# Patient Record
Sex: Male | Born: 1985 | Race: White | Hispanic: No | Marital: Married | Smoking: Former smoker
Health system: Southern US, Community
[De-identification: ages and names within clinical notes are randomized; demographics above are authoritative.]

## PROBLEM LIST (undated history)

## (undated) DIAGNOSIS — K219 Gastro-esophageal reflux disease without esophagitis: Secondary | ICD-10-CM

## (undated) HISTORY — PX: TONSILLECTOMY: SUR1361

## (undated) HISTORY — DX: Gastro-esophageal reflux disease without esophagitis: K21.9

---
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
## 2007-05-21 HISTORY — PX: THYMECTOMY: SHX1063

## 2007-05-24 ENCOUNTER — Ambulatory Visit: Payer: Self-pay | Admitting: Thoracic Surgery

## 2007-06-06 ENCOUNTER — Encounter: Payer: Self-pay | Admitting: Thoracic Surgery

## 2007-06-06 ENCOUNTER — Inpatient Hospital Stay (HOSPITAL_COMMUNITY): Admission: RE | Admit: 2007-06-06 | Discharge: 2007-06-09 | Payer: Self-pay | Admitting: Thoracic Surgery

## 2007-06-08 ENCOUNTER — Ambulatory Visit: Payer: Self-pay | Admitting: Thoracic Surgery

## 2007-06-15 ENCOUNTER — Ambulatory Visit: Payer: Self-pay | Admitting: Thoracic Surgery

## 2007-06-15 ENCOUNTER — Encounter: Admission: RE | Admit: 2007-06-15 | Discharge: 2007-06-15 | Payer: Self-pay | Admitting: Thoracic Surgery

## 2007-07-06 ENCOUNTER — Ambulatory Visit: Payer: Self-pay | Admitting: Thoracic Surgery

## 2007-07-12 ENCOUNTER — Emergency Department (HOSPITAL_COMMUNITY): Admission: EM | Admit: 2007-07-12 | Discharge: 2007-07-12 | Payer: Self-pay | Admitting: Emergency Medicine

## 2007-09-07 ENCOUNTER — Ambulatory Visit: Payer: Self-pay | Admitting: Thoracic Surgery

## 2007-10-13 ENCOUNTER — Ambulatory Visit: Payer: Self-pay | Admitting: Family Medicine

## 2007-10-13 DIAGNOSIS — K219 Gastro-esophageal reflux disease without esophagitis: Secondary | ICD-10-CM | POA: Insufficient documentation

## 2007-10-13 DIAGNOSIS — B259 Cytomegaloviral disease, unspecified: Secondary | ICD-10-CM

## 2007-10-13 DIAGNOSIS — G7000 Myasthenia gravis without (acute) exacerbation: Secondary | ICD-10-CM

## 2007-10-14 ENCOUNTER — Encounter: Payer: Self-pay | Admitting: Family Medicine

## 2007-10-15 LAB — CONVERTED CEMR LAB
Albumin: 4.4 g/dL (ref 3.5–5.2)
Alkaline Phosphatase: 74 units/L (ref 39–117)
CO2: 24 meq/L (ref 19–32)
Calcium: 9.1 mg/dL (ref 8.4–10.5)
Chloride: 101 meq/L (ref 96–112)
Glucose, Bld: 78 mg/dL (ref 70–99)
Hemoglobin: 17.8 g/dL — ABNORMAL HIGH (ref 13.0–17.0)
Lymphocytes Relative: 18 % (ref 12–46)
Lymphs Abs: 2.9 10*3/uL (ref 0.7–4.0)
Monocytes Relative: 12 % (ref 3–12)
Neutro Abs: 11.2 10*3/uL — ABNORMAL HIGH (ref 1.7–7.7)
Neutrophils Relative %: 69 % (ref 43–77)
Potassium: 3.6 meq/L (ref 3.5–5.3)
RBC: 5.96 M/uL — ABNORMAL HIGH (ref 4.22–5.81)
Sodium: 139 meq/L (ref 135–145)
TSH: 1.65 microintl units/mL (ref 0.350–4.50)
Total Protein: 7.2 g/dL (ref 6.0–8.3)
WBC: 16.4 10*3/uL — ABNORMAL HIGH (ref 4.0–10.5)

## 2007-10-18 ENCOUNTER — Encounter: Payer: Self-pay | Admitting: Family Medicine

## 2007-10-18 ENCOUNTER — Telehealth (INDEPENDENT_AMBULATORY_CARE_PROVIDER_SITE_OTHER): Payer: Self-pay | Admitting: *Deleted

## 2007-10-23 ENCOUNTER — Telehealth: Payer: Self-pay | Admitting: Family Medicine

## 2007-10-30 ENCOUNTER — Telehealth: Payer: Self-pay | Admitting: Family Medicine

## 2007-10-31 ENCOUNTER — Encounter: Payer: Self-pay | Admitting: Family Medicine

## 2007-11-15 ENCOUNTER — Ambulatory Visit: Payer: Self-pay | Admitting: Family Medicine

## 2007-11-15 DIAGNOSIS — L906 Striae atrophicae: Secondary | ICD-10-CM

## 2008-01-16 ENCOUNTER — Telehealth (INDEPENDENT_AMBULATORY_CARE_PROVIDER_SITE_OTHER): Payer: Self-pay | Admitting: *Deleted

## 2008-01-17 ENCOUNTER — Ambulatory Visit: Payer: Self-pay | Admitting: Family Medicine

## 2008-02-29 ENCOUNTER — Ambulatory Visit: Payer: Self-pay | Admitting: Family Medicine

## 2008-02-29 ENCOUNTER — Encounter: Admission: RE | Admit: 2008-02-29 | Discharge: 2008-02-29 | Payer: Self-pay | Admitting: Family Medicine

## 2008-02-29 DIAGNOSIS — M545 Low back pain: Secondary | ICD-10-CM

## 2008-02-29 DIAGNOSIS — M25559 Pain in unspecified hip: Secondary | ICD-10-CM

## 2008-03-01 ENCOUNTER — Encounter: Payer: Self-pay | Admitting: Family Medicine

## 2008-03-01 ENCOUNTER — Telehealth: Payer: Self-pay | Admitting: Family Medicine

## 2008-03-02 ENCOUNTER — Telehealth: Payer: Self-pay | Admitting: Family Medicine

## 2008-03-05 ENCOUNTER — Ambulatory Visit: Payer: Self-pay | Admitting: Family Medicine

## 2008-03-05 DIAGNOSIS — B029 Zoster without complications: Secondary | ICD-10-CM | POA: Insufficient documentation

## 2008-03-06 ENCOUNTER — Encounter: Payer: Self-pay | Admitting: Family Medicine

## 2008-03-12 ENCOUNTER — Telehealth: Payer: Self-pay | Admitting: Family Medicine

## 2008-03-18 ENCOUNTER — Telehealth: Payer: Self-pay | Admitting: Family Medicine

## 2008-03-20 ENCOUNTER — Telehealth: Payer: Self-pay | Admitting: Family Medicine

## 2008-04-12 ENCOUNTER — Ambulatory Visit: Payer: Self-pay | Admitting: Family Medicine

## 2008-04-12 DIAGNOSIS — J069 Acute upper respiratory infection, unspecified: Secondary | ICD-10-CM | POA: Insufficient documentation

## 2008-05-06 ENCOUNTER — Telehealth: Payer: Self-pay | Admitting: Family Medicine

## 2008-05-06 ENCOUNTER — Ambulatory Visit: Payer: Self-pay | Admitting: Family Medicine

## 2008-05-06 DIAGNOSIS — H109 Unspecified conjunctivitis: Secondary | ICD-10-CM | POA: Insufficient documentation

## 2008-05-07 ENCOUNTER — Telehealth: Payer: Self-pay | Admitting: Family Medicine

## 2008-06-17 ENCOUNTER — Encounter: Payer: Self-pay | Admitting: Family Medicine

## 2008-08-07 ENCOUNTER — Encounter: Payer: Self-pay | Admitting: Family Medicine

## 2008-10-11 ENCOUNTER — Encounter: Payer: Self-pay | Admitting: Family Medicine

## 2009-01-02 IMAGING — CR DG CHEST 2V
2 series · 2 of 2 positions shown · non-contrast
Comparison: 06/09/2007

CLINICAL DATA: MYASTHENIA GRAVIS; POSTOP FROM THYMECTOMY

CHEST - 2 VIEW

[w chest pa]
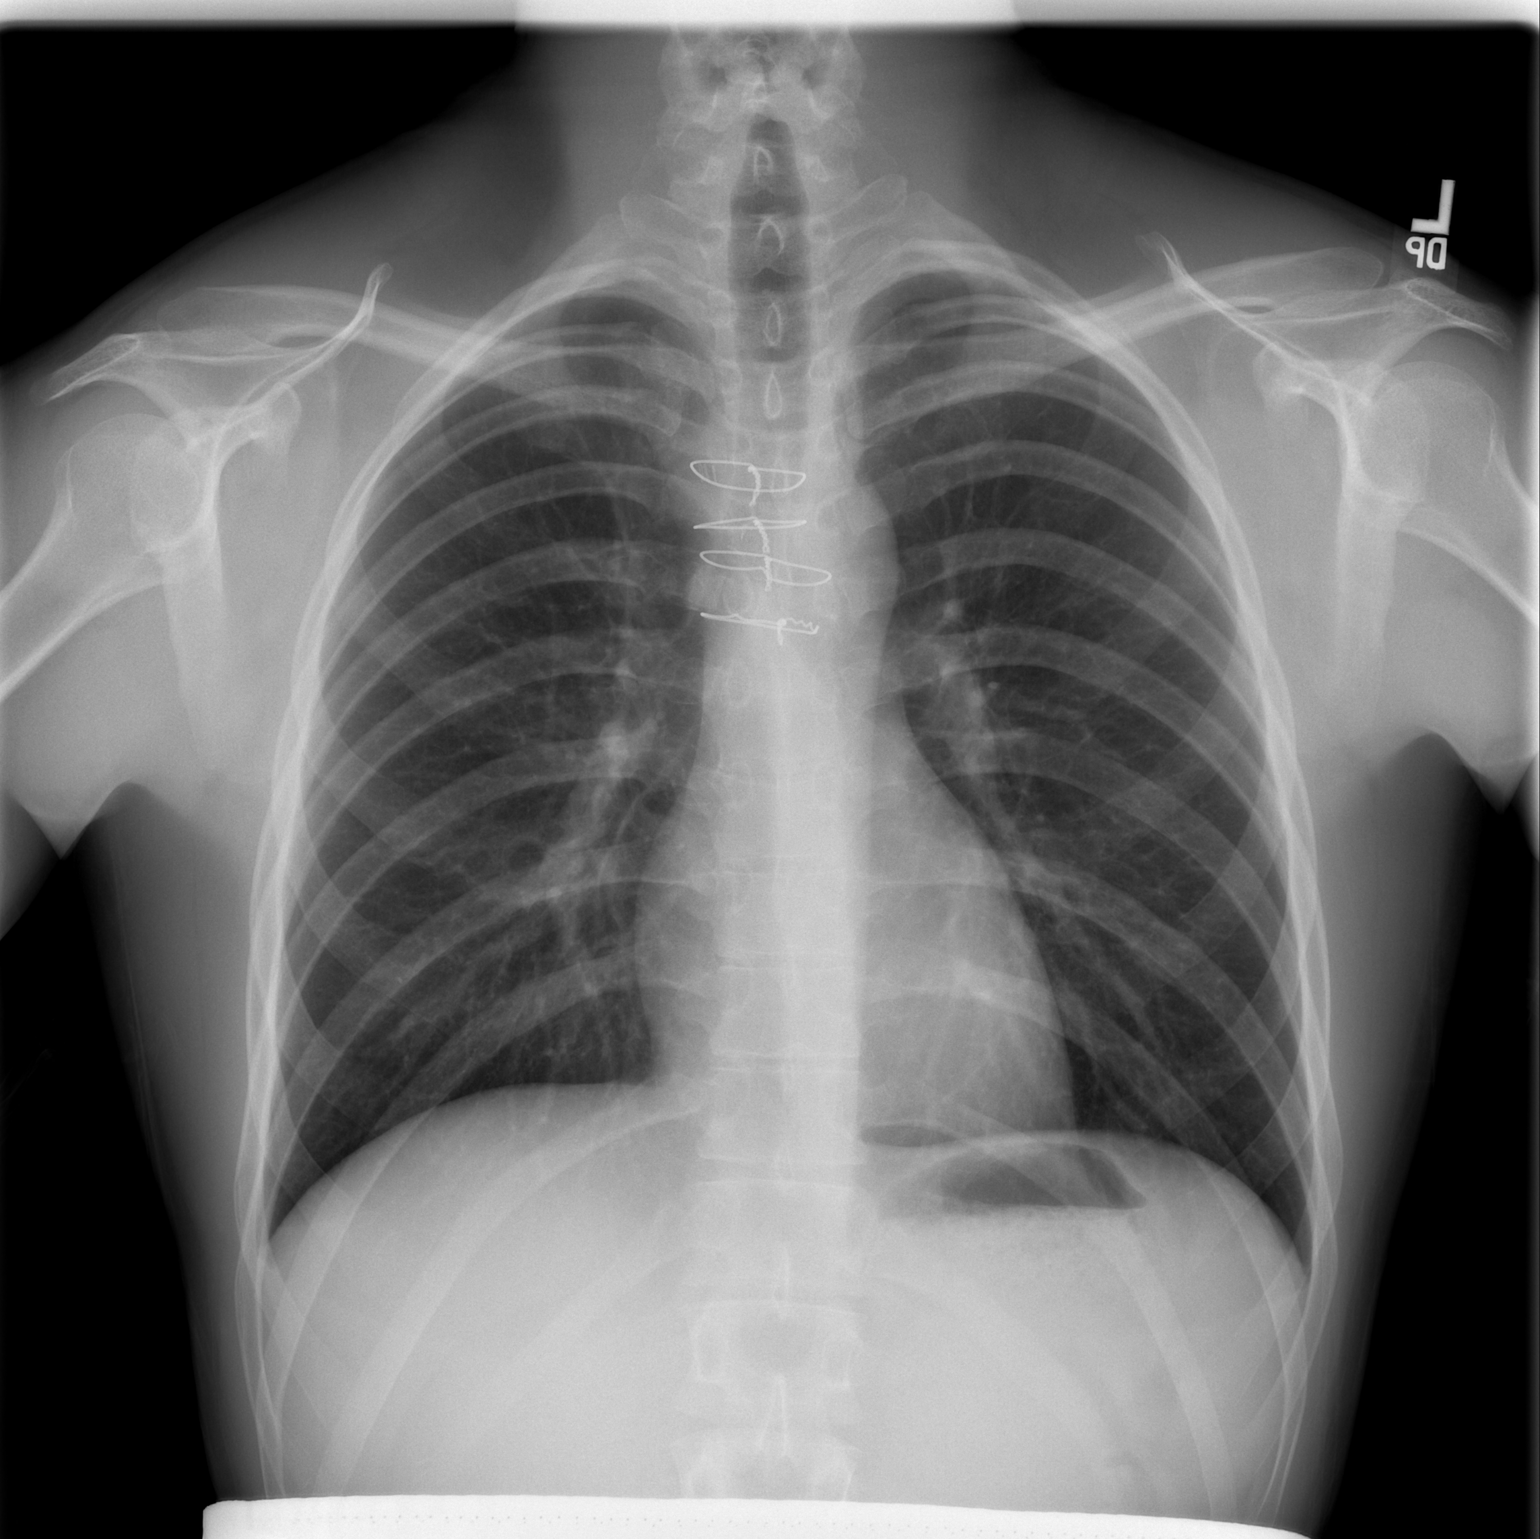

[w chest lat]
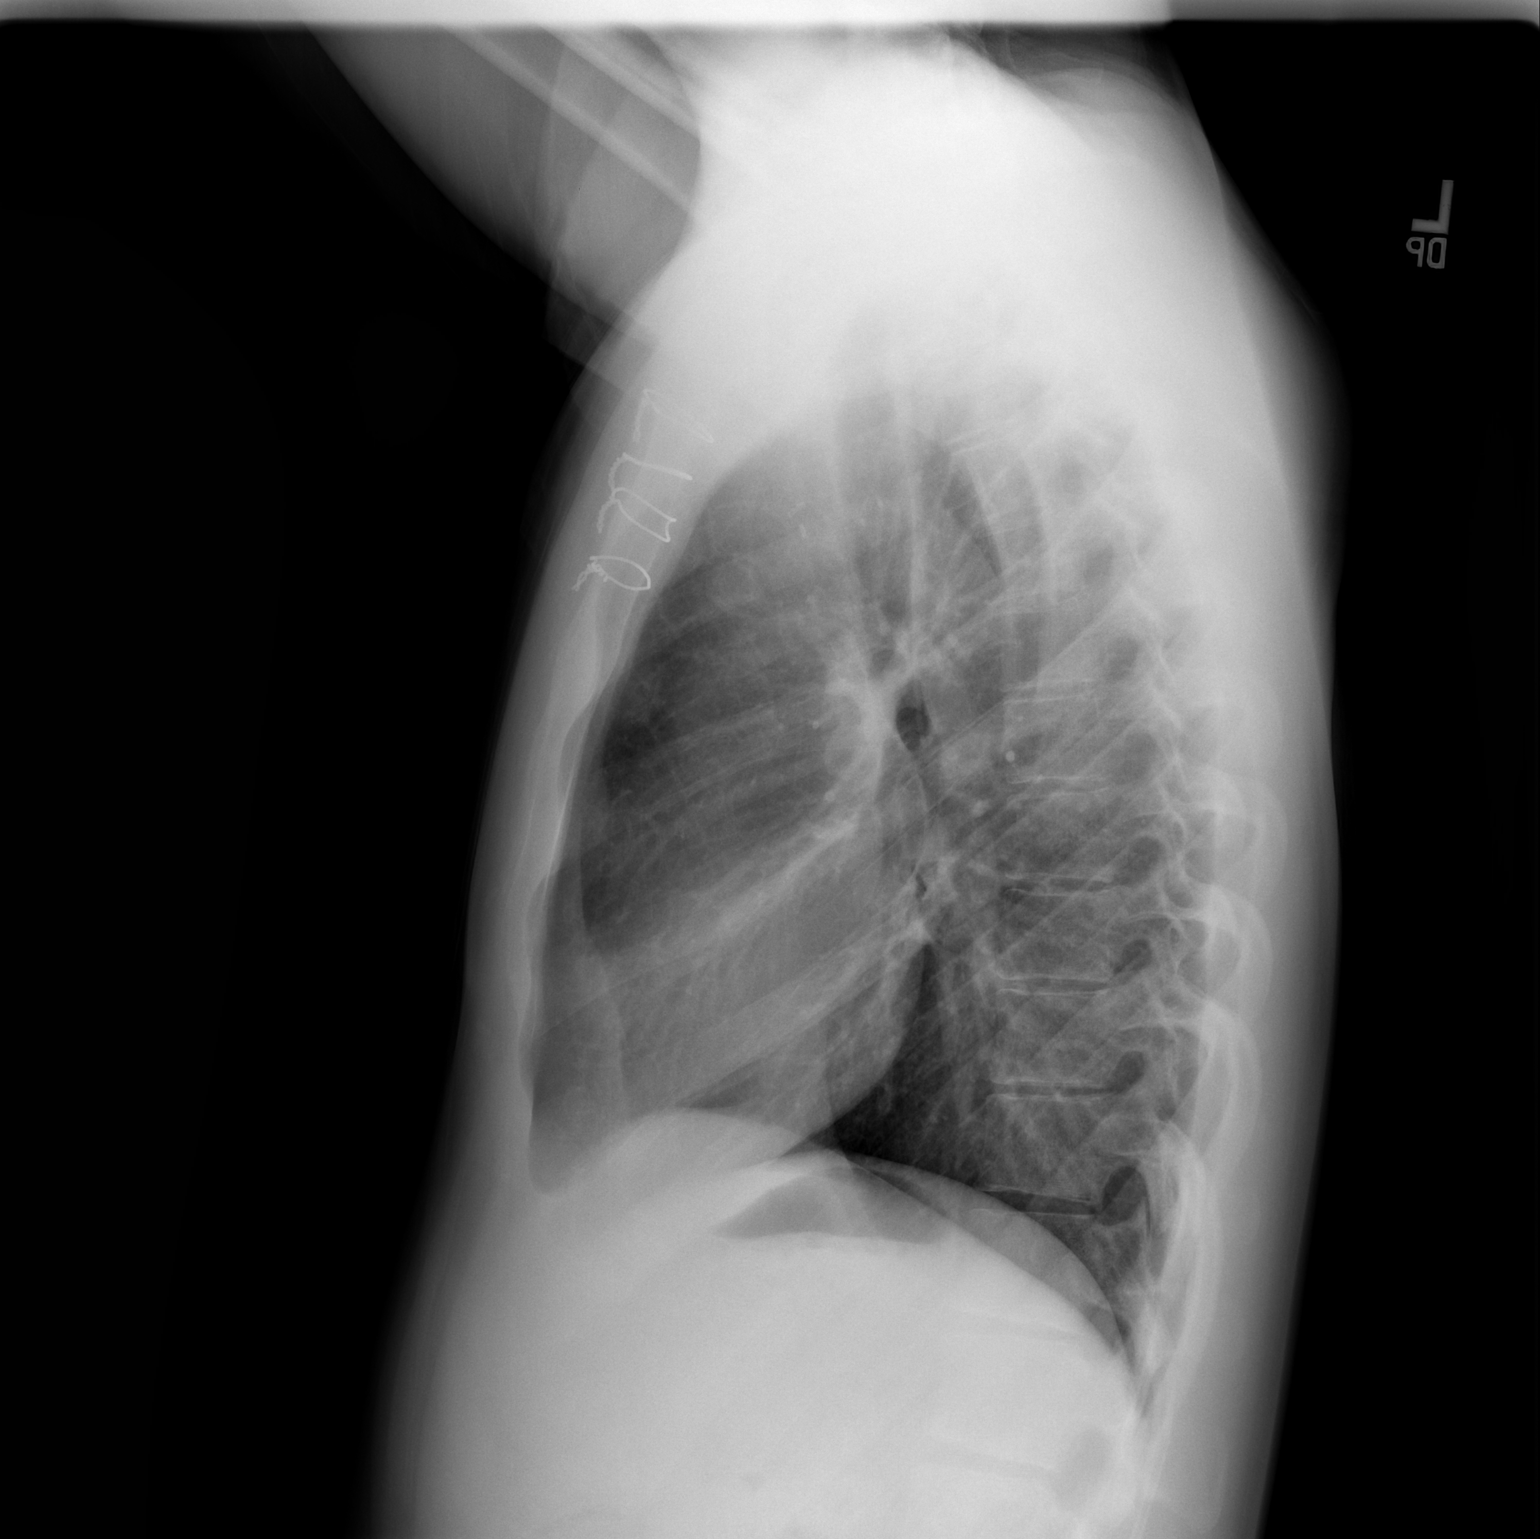

[2 of 2 positions shown; findings below may reference images not displayed]

FINDINGS: Heart size and mediastinal contours are normal.  The
patient has undergone prior sternotomy.  Both lungs are clear.
There is no evidence of pneumothorax or pleural effusion.
IMPRESSION: No active cardiopulmonary disease.

## 2009-12-24 ENCOUNTER — Ambulatory Visit: Payer: Self-pay | Admitting: Family Medicine

## 2009-12-24 DIAGNOSIS — J019 Acute sinusitis, unspecified: Secondary | ICD-10-CM | POA: Insufficient documentation

## 2010-04-21 NOTE — Assessment & Plan Note (Signed)
Summary: sinusitis   Vital Signs:  Patient profile:   25 year old male Height:      65.3 inches Weight:      144 pounds BMI:     23.83 O2 Sat:      98 % on Room air Temp:     98.8 degrees F oral Pulse rate:   72 / minute BP sitting:   123 / 85  (left arm) Cuff size:   large  Vitals Entered By: Payton Spark CMA (December 24, 2009 3:57 PM)  O2 Flow:  Room air CC: Facial pressure, congestion, cough and HA x 1 week. Mucinex not helping   Primary Care Lazara Grieser:  Nani Gasser  CC:  Facial pressure, congestion, and cough and HA x 1 week. Mucinex not helping.  History of Present Illness: 25 yo WM presents for runny nose, congestion, sneezing and HAs that started a wk ago.  He has taken Mucinex and some phenylephrine.  The meds have helped some.  He has HA pressure behind his eyes.  He feels like he is getting worse.  Denies much rhinorrhea.  Has a cough also.  Has a sore throat also.  Denies fevers or chills.  The cough is improved by taking Nyquil at night.  Denies any GI upset.  Has missed work.  Not taking anything for HA.  He is on Prednsione and Cellcept for Myasthenia Gravis.    Current Medications (verified): 1)  Prednisone 5 Mg Tabs (Prednisone) .... Take 1 1/2 Tabs By Mouth Once Daily 2)  Cellcept 250 Mg  Caps (Mycophenolate Mofetil) .... Take 2500mg  Daily  Allergies (verified): No Known Drug Allergies  Review of Systems      See HPI  Physical Exam  General:  alert, well-developed, well-nourished, and well-hydrated.   Head:  maxillary and frontal sinuses TTP Eyes:  conjunctiva clear Ears:  EACs patent; TMs translucent and gray with good cone of light and bony landmarks.  Nose:  nasal congestion present Mouth:  pharynx pink and moist.   Neck:  no masses.   Lungs:  Normal respiratory effort, chest expands symmetrically. Lungs are clear to auscultation, no crackles or wheezes. Heart:  Normal rate and regular rhythm. S1 and S2 normal without gallop, murmur,  click, rub or other extra sounds. Skin:  color normal.   Cervical Nodes:  No lymphadenopathy noted   Impression & Recommendations:  Problem # 1:  SINUSITIS, ACUTE (ICD-461.9) Will treat with 10 days of Amoxicillin along with supportive care measures.  AT risk for secondary bacterial infeciton, on immunosuppressive agents for Myasthenia Gravis.  Call if not improved in the next 7-10 days. His updated medication list for this problem includes:    Amoxicillin 500 Mg Caps (Amoxicillin) .Marland Kitchen... 1 capsule by mouth three times a day x 10 days  Complete Medication List: 1)  Prednisone 5 Mg Tabs (Prednisone) .... Take 1 1/2 tabs by mouth once daily 2)  Cellcept 250 Mg Caps (Mycophenolate mofetil) .... Take 2500mg  daily 3)  Amoxicillin 500 Mg Caps (Amoxicillin) .Marland Kitchen.. 1 capsule by mouth three times a day x 10 days  Patient Instructions: 1)  Take 10 days of Amoxcillin for sinusitis.  Take all of it. 2)  Use Advil Cold and Sinus during the day and Nyquil at night for symptom reflief. 3)  Call if not improving in the next 7-10 days. Prescriptions: AMOXICILLIN 500 MG CAPS (AMOXICILLIN) 1 capsule by mouth three times a day x 10 days  #30 x 0  Entered and Authorized by:   Seymour Bars DO   Signed by:   Seymour Bars DO on 12/24/2009   Method used:   Electronically to        [facility] [phone]* (retail)       [address] [place], [place]  [address]       Ph: [phone] or [phone]       Fax: [phone]   RxID:   [id number]

## 2010-08-04 ENCOUNTER — Encounter: Payer: Self-pay | Admitting: Family Medicine

## 2010-08-04 NOTE — Op Note (Signed)
[name], Jacob Kline           ACCOUNT NO.:  [id number]   MEDICAL RECORD NO.:  [id number]          PATIENT TYPE:  INP   LOCATION:  2899                         FACILITY:  [facility]   PHYSICIAN:  Ines Bloomer, M.D. DATE OF BIRTH:  04-Sep-1985   DATE OF PROCEDURE:  06/06/2007  DATE OF DISCHARGE:                               OPERATIVE REPORT   PREOPERATIVE DIAGNOSIS:  Myasthenia gravis.   POSTOPERATIVE DIAGNOSIS:  Myasthenia gravis.   OPERATION PERFORMED:  Partial median sternotomy, thymectomy.   SURGEON:  Dr. Patricia Nettle. Burney   FIRST ASSISTANT:  Zadie Rhine, P.A.-C.   General anesthesia.  After percutaneous insertion of all monitoring  lines, the patient underwent general anesthesia.  Under general  anesthesia, the chest was prepped and draped in usual sterile manner.  An incision was made from the sternal notch down to the angle of Lewy,  and the muscle was divided with electrocautery.  The sternum was opened  with the sternal.  The laminar spreader was inserted.  Dissection was  started superiorly, dissecting out the upper two horns of the thymus  gland, dissecting off the left side and then the right side and then  identifying the innominate vein, dividing two branches to the innominate  vein and proximally with 2-0 silk and clipping them distally.  Then, the  innominate vein was dissected up and looped with vascular tape, and  dissection was carried down the left side, dissecting the thymus gland  off the pleura, and then off the pericardium, dissecting both the  anterior and posterior portions of the thymus gland around the  innominate vein.  After the left-sided had been freed up, we then turned  to the right side and dissected up the right lower pole of the thymic  gland and then finally finished the dissection of the left upper horn of  the thymus gland.  Bleeding was electrocoagulated.  A #19 Blake drain  was brought in through separate stab wound.  The chest was  closed with 4  wires, #1 Vicryl in the muscle layer, 2-0 Vicryl subcutaneous tissue,  and Dermabond for the skin.  The patient returned to the recovery room  in stable condition.      Ines Bloomer, M.D.  Electronically Signed     DPB/MEDQ  D:  06/06/2007  T:  06/06/2007  Job:  [id number]   cc:   Marlan Palau, M.D.  Ines Bloomer, M.D.

## 2010-08-04 NOTE — Letter (Signed)
June 15, 2007   C. Lesia Sago, M.D.  [address]  [address]  [place] [address]   Re:  CHAMPION, CORALES                   DOB:  [birth year]   Dear Dr. Anne Hahn:   Mr. Carswell came in today.  His incision is well-healed and his chest x-  ray shows normal postoperative changes.  Pathological findings showed a  thymic hyperplasia.  He is doing well overall and will start driving and  then return to work in about a week.  I will see him back again in three  weeks with a chest x-ray.  Blood pressure was 124/78, pulse 64,  respirations 18, and saturations are 99%.   Ines Bloomer, M.D.  Electronically Signed   DPB/MEDQ  D:  06/15/2007  T:  06/16/2007  Job:  [id number]

## 2010-08-04 NOTE — Assessment & Plan Note (Signed)
OFFICE VISIT   Jacob Kline, Jacob Kline  DOB:  June 01, 1985                                        July 06, 2007  CHART #:  [id number]   Jacob Kline comes in today for follow-up status post a thymectomy on  June 06, 2007.  He is doing well.  He has returned to work at this  point, and is driving.  He is having some problems with myasthenia  gravis, but aside from that, he is doing extremely well.   PHYSICAL EXAMINATION:  VITAL SIGNS:  Blood pressure 131/84, pulse 72,  respirations 18, O2 saturation 98%.  On physical exam his sternal incision has healed well, and his sternum  is stable to palpation.  HEART:  Regular rate and rhythm without murmurs, rubs or gallops.  LUNGS:  Clear.   ASSESSMENT/PLAN:  He is doing well following a thymectomy in March.  Dr.  Edwyna Shell is seeing the patient, today; and we will have him follow up in 2  months with a chest x-ray.   Jacob Kline, M.D.  Electronically Signed   GC/MEDQ  D:  07/06/2007  T:  07/06/2007  Job:  [id number]

## 2010-08-04 NOTE — H&P (Signed)
NAMEBRODERICK, Kline           ACCOUNT NO.:  [id number]   MEDICAL RECORD NO.:  [id number]          PATIENT TYPE:  INP   LOCATION:  NA                           FACILITY:  [facility]   PHYSICIAN:  Jacob Kline, M.D. DATE OF BIRTH:  August 24, 1985   DATE OF ADMISSION:  DATE OF DISCHARGE:                              HISTORY & PHYSICAL   CHIEF COMPLAINT:  Weakness.   HISTORY OF PRESENT ILLNESS:  This 25 year old patient developed some  generalized weakness and problems with early fatigability and focusing  of his eyes.  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  He  presently on Mestinon and prednisone with some improvement but still  very weak.  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  CT scan  showed that the thymic gland had some mild hyperplasia but no thymoma.  He has not had weight loss, fever, chills or excessive sputum.  Pulmonary function test showed an FVC of 3.08 or 70% of predicted, FEV1  a 2.45 or 65% of predicted. MRI of the brain showed nonspecific  subcortical white matter changes in the temporal region.  He is admitted  for a thymectomy.   CURRENT MEDICATIONS:  1. Mestinon 30 mg every 4 hours.  2. Prednisone 10 mg daily.   ALLERGIES:  He has no drug allergies.   PAST MEDICAL HISTORY:  1. He has had a previous tonsillectomy.  2. Fracture of a right tibia and fibula.   SOCIAL HISTORY:  He is married, lives in [place], has a bachelor's  degree and works as a Emergency planning/management officer.  Does not smoke or drink alcohol.   FAMILY HISTORY:  Positive for hypertension, hypercholesterolemia.   REVIEW OF SYSTEMS:  Weight has been stable.  CARDIAC:  No angina or  atrial fibrillation.  PULMONARY:  No hemoptysis, asthma or bronchitis.  GASTROINTESTINAL:  No nausea, vomiting, constipation, diarrhea.  GENITOURINARY:  No kidney  disease, dysuria or frequent urination.  VASCULAR:  No claudication, DVT, TIAs.  NEUROLOGIC:  No dizziness,  headaches, blackouts or seizures, but see history of present illness.  MUSCULOSKELETAL:  No arthritis or joint pain or muscular pain.  PSYCHIATRIC:  No nervousness or depression.  EYES/EENT:  No changes in  his eyesight or hearing.  HEMATOLOGIC:  No problems with bleeding or  clotting disorders.   PHYSICAL EXAMINATION:  He is a well-developed Caucasian male in no acute  distress.  His blood pressure 139/60.  Pulse 75.  Respirations 18.  Sats  were 98%.  HEAD:  Atraumatic.  EYES:  Pupils are equal, round and reactive to light and accommodation.  Extraocular movements are normal.  EARS:  Tympanic membranes are intact.  NOSE:  There is no septal deviation.  THROAT:  Without lesion.  Uvula is in the midline.  NECK:  Supple without thyromegaly.  There is no supraclavicular or  axillary adenopathy.  CHEST:  Clear to auscultation and percussion.  HEART:  Regular sinus rhythm.  No murmurs.  ABDOMEN:  Soft.  There is no hepatosplenomegaly.  Pulses are 2+.  There  is no clubbing or edema.   IMPRESSION:  1. Myasthenia gravis.  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   PLAN:  Thymectomy.      Jacob Kline, M.D.  Electronically Signed     DPB/MEDQ  D:  06/05/2007  T:  06/05/2007  Job:  [id number]

## 2010-08-04 NOTE — Letter (Signed)
May 24, 2007   C. Lesia Sago, M.D.  [address]  [address]  [place] [address]   Re:  GREGORI, ABRIL                   DOB:  [birth year]   Dear Mellody Dance:   I appreciate the opportunity of seeing Mr. Ketcham.  This 25 year old  patient has developed some generalized weakness and some problems as far  as early fatigability and over focusing with his eyes.  He underwent a  workup which revealed a mild neuromuscular transmission disorder on his  nerve stimulation study and then he was found to have myasthenia gravis  with his acetylcholine  antibodies.  He is presently on Mestinon and  prednisone with some improvement, but he still gets very weak.  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  A CT scan showed a thymic gland that may have some mild  hyperplasia given his age, but no thymoma.  He has had no weight loss,  fever, chills, excessive sputum.  Pulmonary function tests show an FVC  of 3.08 or 70% of predicted with an FEV1 of 2.45 or 65% of predicted.  His MRI of the brain shows nonspecific subcortical white matter changes  in the temporal region.   CURRENT MEDICATIONS:  1. Mestinon 30 mg every 4 hours.  2. Prednisone 10 mg daily.   ALLERGIES:  No known drug allergies.   PAST MEDICAL HISTORY:  He has had a previous tonsillectomy, right  fracture of tibia and fibula.   SOCIAL HISTORY:  He is married.  He lives in the [place] area.  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  He does not  smoke or drink.   FAMILY HISTORY:  Positive hypertension, hypercholesterolemia.   REVIEW OF SYSTEMS:  His weight has been stable.  CARDIAC:  No angina or  atrial fibrillation.  PULMONARY:  No hemoptysis, bronchitis, or asthma.  GASTROINTESTINAL:  No nausea, vomiting, constipation, or diarrhea.  GENITOURINARY:  No kidney disease, dysuria, or frequent urination.  VASCULAR:  No claudication, DVT, TIA's.  NEUROLOGY:   No dizziness,  headaches, blackouts, or seizures, but see history of present illness.  MUSCULOSKELETAL:  No arthritis or joint pain.  No psychiatric illnesses.  EYE/ENT:  No change in his eye sight or hearing.  HEMATOLOGIC:  No  problem with clotting disorders or anemia.   PHYSICAL EXAMINATION:  GENERAL:  He is a well-developed Caucasian male  in no acute distress.  VITAL SIGNS:  Blood pressure 139/60, pulse 75, respirations 16, and  saturations were 97%.  HEENT:  Unremarkable.  NECK:  Supple without thyromegaly.  There is no supraclavicular or  axillary adenopathy.  CHEST:  Clear to auscultation and percussion.  HEART:  Regular rate and rhythm.  No murmurs.  ABDOMEN:  Soft.  There is no hepatosplenomegaly.  Bowel sounds are  normal.  EXTREMITIES:  Pulses are 2+.  There is no clubbing or edema.  NEUROLOGY:  He has some generalized weakness, but otherwise he is  oriented x3.  Sensory and motor are intact.  Cranial nerves II-XII  grossly intact.   IMPRESSION:  I feel that he has myasthenia gravis and would probably  benefit from a thymectomy.  The risks of the procedure were explained to  the patient and his family and they agreed to the procedure.  We plan to  do on March 17, at [facility].  I did call Dr. Anne Hahn about  plasmapheresis and he did not feel that was needed.   Ines Bloomer, M.D.  Electronically Signed   DPB/MEDQ  D:  05/24/2007  T:  05/24/2007  Job:  [id number]

## 2010-08-04 NOTE — Discharge Summary (Signed)
Jacob Kline, Jacob Kline           ACCOUNT NO.:  [id number]   MEDICAL RECORD NO.:  [id number]          PATIENT TYPE:  INP   LOCATION:  2004                         FACILITY:  [facility]   PHYSICIAN:  Ines Bloomer, M.D. DATE OF BIRTH:  1985-12-04   DATE OF ADMISSION:  06/06/2007  DATE OF DISCHARGE:                               DISCHARGE SUMMARY   FINAL DIAGNOSES:  1. Thymic mass with myasthenia gravis.  2. Thymic hyperplasia.   SECONDARY DIAGNOSES:  1. Status post tonsillectomy.  2. History of fracture of right tibia and fibula.   IN-HOSPITAL/OUTPATIENT PROCEDURES:  Partial sternotomy for thymectomy.   HISTORY AND PHYSICAL AND HOSPITAL COURSE:  The patient is a 25 year old  male who had developed generalized weakness and problems with early  fatigability and focusing of his eyes.  He underwent workup and was  found to have mild neuromuscular transmission disorder on his nerve  stimulation study and was found to have myasthenia gravis with  acetylcholine antibodies.  The patient presented on Mestinon and  prednisone with some improvement but still very weak.  He works as a  Emergency planning/management officer in [place].  CT scan done showed that the thymus  gland has some mild hyperplasia but no thymoma.  The patient denies  weight loss, fevers, chills, or excessive sputum.  Pulmonary function  test done showed an FVC of 3.08 % of predicted, FEV1 is 2.45 or 65% of  predicted.  MRI of the brain showed nonspecific subcortical white matter  changes in the temporal region.  The patient was seen and evaluated by  Dr. Edwyna Shell.  Dr. Edwyna Shell and I discussed with the patient regarding  thymectomy.  We discussed the risk and benefits with the patient.  The  patient acknowledged understanding and agreed to proceed.  Surgery was  scheduled for June 06, 2007.  For details of the patient's past medical  history and physical exam, please see dictated H&P.   The patient taken to the operating room on June 06, 2007, where he  underwent partial sternotomy for thymectomy.  The patient tolerated this  procedure well and was transferred to the intensive care unit in stable  condition.  The patient is able to be extubated postoperatively.  Postoperatively, he was noted to be alert and oriented x4, neuro intact.  The patient's postoperative course was pretty much unremarkable.  He did  have a mild atrial arrhythmia immediately following surgery and started  on Lopressor low dose.  This resolved and the patient remained in normal  sinus rhythm with heart rate in the 70s to 80s in remainder of his  postoperative course.  Postoperatively, the chest x-ray was obtained and  remained stable.  He had minimal drainage from his JP drain and this was  able to be discontinued postop day 2 without difficulty.  The patient's  vital signs were monitored closely.  He remained afebrile.  He was able  to be weaned off oxygen, satting greater than 90% on room air.  He was  able to get out of the bed and ambulate 3 times a day, progressing well.  He was tolerating diet  well.  No nausea or vomiting noted.  His partial  sternotomy incision was clean, dry, and intact, and healing well.  Labs  on postop day 2 showed a white count of 11.7, hemoglobin of 14.8,  hematocrit 43.2, platelet count 137.  Sodium of 139, potassium of 3.6,  chloride of 100, bicarbonate of 33, BUN of 11, creatinine of 1.14,  glucose of 99.   The patient was considered ready for discharge home in the a.m., pending  he continued to progress well.   FOLLOWUP APPOINTMENTS:  Followup appointment has been arranged with Dr.  Edwyna Shell from June 15, 2007 at 4:45 p.m.  The patient will need to obtain  a PA and lateral chest x-ray 30 minutes prior to this appointment.   ACTIVITY:  The patient is instructed no driving until released to do so,  no heavy lifting over 10 pounds.  He was told to ambulate 3-4 times per  day, progress as tolerated, and to continue  his breathing exercises.   INCISIONAL CARE:  The patient was told to shower, washing his incisions  using soap and water.  He is to contact the office if he develops any  drainage or openings from any of his incision sites.   DIET:  The patient is to begin on diet to be low fat, low salt.   DISCHARGE MEDICATIONS:  1. Prednisone 20 mg daily.  2. Mestinon 30 mg every 2-1/2 hours.  3. Lopressor 12.5 mg b.i.d.  4. Percocet 5/325 mg 1-2 tabs q.4-6 hours p.r.n.      Jacob Kline, [place]      Ines Bloomer, M.D.  Electronically Signed    KMD/MEDQ  D:  06/08/2007  T:  06/09/2007  Job:  [id number]

## 2010-08-04 NOTE — Assessment & Plan Note (Signed)
OFFICE VISIT   CAN, LUCCI  DOB:  1985-04-01                                        September 07, 2007  CHART #:  [id number]   This patient came today after median thymectomy.  His incisions are well  healed.  His chest x-ray looks good.  He is going to [facility] and is now on  Mestinon 30 mg daily, CellCept 225 mg daily, and prednisone 60 mg daily.  Overall, he is doing well, and I will release him back to his medical  doctor.  I will see him back again if he has any further problems.  His  blood pressure was 143/91, pulse 86, respirations 18, and sats were 97%.   Ines Bloomer, M.D.  Electronically Signed   DPB/MEDQ  D:  09/07/2007  T:  09/08/2007  Job:  [id number]

## 2010-08-05 ENCOUNTER — Ambulatory Visit (INDEPENDENT_AMBULATORY_CARE_PROVIDER_SITE_OTHER): Payer: BC Managed Care – PPO | Admitting: Family Medicine

## 2010-08-05 ENCOUNTER — Encounter: Payer: Self-pay | Admitting: Family Medicine

## 2010-08-05 VITALS — BP 124/86 | HR 68 | Temp 98.6°F | Ht 65.5 in | Wt 140.0 lb

## 2010-08-05 DIAGNOSIS — J329 Chronic sinusitis, unspecified: Secondary | ICD-10-CM

## 2010-08-05 DIAGNOSIS — K469 Unspecified abdominal hernia without obstruction or gangrene: Secondary | ICD-10-CM

## 2010-08-05 MED ORDER — HYDROCODONE-HOMATROPINE 5-1.5 MG/5ML PO SYRP: 5.0000 mL | ORAL_SOLUTION | Freq: Every evening | ORAL | Status: DC | PRN

## 2010-08-05 MED ORDER — AMOXICILLIN-POT CLAVULANATE 875-125 MG PO TABS: 1.0000 | ORAL_TABLET | Freq: Two times a day (BID) | ORAL | Status: AC

## 2010-08-05 NOTE — Patient Instructions (Signed)
Call if your sinus infection is not better in 10 days.

## 2010-08-05 NOTE — Progress Notes (Signed)
  Subjective:    Patient ID: Jacob Kline, male    DOB: 1985-07-20, 25 y.o.   MRN: [id number]  Sinusitis The current episode started 1 to 4 weeks ago (2 weeks). There has been no fever. The pain is moderate. Associated symptoms include congestion, coughing, headaches, sinus pressure and a sore throat. Pertinent negatives include no ear pain, neck pain, shortness of breath or swollen glands. Past treatments include spray decongestants (zyrtec). The treatment provided no relief.  cough suppressants not helping either.    Also has a lump on the LLQ that has been present for months. He thinks it might be a hernia. He notices it sticks out more when he coughs. Nontender. No nausea. No chang ein BMs.  No prior hernias.  He is doing well with his myasthenia gravis.  He is now down to 2.5mg  on his prednisone.    Review of Systems  HENT: Positive for congestion, sore throat and sinus pressure. Negative for ear pain and neck pain.   Respiratory: Positive for cough. Negative for shortness of breath.   Neurological: Positive for headaches.       Objective:   Physical Exam  Constitutional: He appears well-developed and well-nourished.  HENT:  Head: Normocephalic and atraumatic.  Right Ear: External ear normal.  Left Ear: External ear normal.  Nose: Nose normal.  Mouth/Throat: Oropharynx is clear and moist.  Eyes: Conjunctivae and EOM are normal. Pupils are equal, round, and reactive to light.  Neck: Neck supple. No thyromegaly present.  Cardiovascular: Normal rate, regular rhythm and normal heart sounds.   Pulmonary/Chest: Effort normal and breath sounds normal.  Abdominal:       Left lower abdomen above the groin crease he has a bulge that does protrude with vasalva and can be reduced.    Lymphadenopathy:    He has no cervical adenopathy.  Skin: Skin is warm and dry.  Psychiatric: He has a normal mood and affect.          Assessment & Plan:  Hernia - likely hernia based on exam.  Will refer to gen sx for further evaluation and tx.    Sinsusitis. - Symptomatic care. Sxs for 2 weeks so will tx with ABX. He did well with amox last time and didn't affect his MG. Will use augmentin this time.  Call if any concerns.  Call if not improving since on immunosuppressants.

## 2010-08-20 DIAGNOSIS — K4020 Bilateral inguinal hernia, without obstruction or gangrene, not specified as recurrent: Secondary | ICD-10-CM | POA: Insufficient documentation

## 2010-09-25 ENCOUNTER — Encounter: Payer: Self-pay | Admitting: Family Medicine

## 2010-09-25 ENCOUNTER — Ambulatory Visit (INDEPENDENT_AMBULATORY_CARE_PROVIDER_SITE_OTHER): Payer: BC Managed Care – PPO | Admitting: Family Medicine

## 2010-09-25 VITALS — BP 119/80 | HR 70 | Temp 98.4°F | Ht 65.0 in | Wt 139.0 lb

## 2010-09-25 DIAGNOSIS — H1030 Unspecified acute conjunctivitis, unspecified eye: Secondary | ICD-10-CM

## 2010-09-25 DIAGNOSIS — H1032 Unspecified acute conjunctivitis, left eye: Secondary | ICD-10-CM | POA: Insufficient documentation

## 2010-09-25 MED ORDER — POLYMYXIN B-TRIMETHOPRIM 10000-0.1 UNIT/ML-% OP SOLN: 1.0000 [drp] | OPHTHALMIC | Status: AC

## 2010-09-25 NOTE — Patient Instructions (Signed)
Use polytrim x 1 wk.  Use warm moist compresses on the L eye.    Call if not resolved in 1 wk.

## 2010-09-25 NOTE — Assessment & Plan Note (Signed)
Vision normal.  Likely to be viral but he is on day 5 and is on immunosuppressants for myasthenia gravis.  Will cover him with polytrim drops.  Use warm moist compresses and clean towels/ linens in hot water, wash hands often to prevent spread.  Call if not improving.  abscence of pain, contact lenses or hx of foreign body rules out more worrisome causes for redness.

## 2010-09-25 NOTE — Progress Notes (Signed)
  Subjective:    Patient ID: Jacob Kline, male    DOB: 31-Aug-1985, 25 y.o.   MRN: [id number]  HPI  25 yo M present for redness in the L eye x 5 days.  Has a goupy drainge in the morning.  He has a slight blurring.  He does not wear glasses or contacts.  No eye pain.  No lid swelling.  He used an old antibacterial eye drop.    BP 119/80  Pulse 70  Temp(Src) 98.4 F (36.9 C) (Oral)  Ht 5\' 5"  (1.651 m)  Wt 139 lb (63.05 kg)  BMI 23.13 kg/m2  SpO2 98%   Review of Systems as per HPI     Objective:   Physical Exam  Constitutional: He appears well-developed and well-nourished.  HENT:  Head: Normocephalic and atraumatic.  Mouth/Throat: Oropharynx is clear and moist.  Eyes: EOM and lids are normal. Pupils are equal, round, and reactive to light. Right eye exhibits no discharge. Left eye exhibits no discharge. Right conjunctiva is not injected. Left conjunctiva is injected. No scleral icterus.    Lymphadenopathy:    He has no cervical adenopathy.          Assessment & Plan:

## 2010-12-14 LAB — CBC
HCT: 50.4
HCT: 43.2
MCHC: 34.1
MCHC: 34.7
MCV: 87
Platelets: 146 — ABNORMAL LOW
Platelets: 158
RDW: 13.7
WBC: 11.7 — ABNORMAL HIGH

## 2010-12-14 LAB — COMPREHENSIVE METABOLIC PANEL
ALT: 16
Albumin: 4.4
Alkaline Phosphatase: 52
BUN: 13
CO2: 33 — ABNORMAL HIGH
Calcium: 9
Creatinine, Ser: 0.89
GFR calc non Af Amer: >60
Glucose, Bld: 99
Potassium: 3.9
Potassium: 3.6
Sodium: 139
Total Protein: 7.5

## 2010-12-14 LAB — POCT I-STAT 3, ART BLOOD GAS (G3+)
Acid-Base Excess: 4 — ABNORMAL HIGH
Bicarbonate: 29.8 — ABNORMAL HIGH
O2 Saturation: 98
Patient temperature: 98.2
TCO2: 31

## 2010-12-14 LAB — TYPE AND SCREEN
ABO/RH(D): O POS
Antibody Screen: NEGATIVE

## 2010-12-14 LAB — PROTIME-INR: INR: 0.9

## 2010-12-14 LAB — BASIC METABOLIC PANEL
BUN: 4 — ABNORMAL LOW
CO2: 33 — ABNORMAL HIGH
Calcium: 9.5
Chloride: 99
Creatinine, Ser: 0.88
Creatinine, Ser: 1.25
GFR calc Af Amer: >60
Potassium: 3.8

## 2010-12-14 LAB — BLOOD GAS, ARTERIAL
Bicarbonate: 26.6 — ABNORMAL HIGH
FIO2: 0.21
O2 Saturation: 97.1
Patient temperature: 98.6

## 2010-12-14 LAB — URINALYSIS, ROUTINE W REFLEX MICROSCOPIC
Nitrite: NEGATIVE
Specific Gravity, Urine: 1.022
Urobilinogen, UA: 0.2

## 2010-12-14 LAB — APTT: aPTT: 28

## 2011-04-09 ENCOUNTER — Ambulatory Visit (INDEPENDENT_AMBULATORY_CARE_PROVIDER_SITE_OTHER): Payer: BC Managed Care – PPO | Admitting: Physician Assistant

## 2011-04-09 ENCOUNTER — Encounter: Payer: Self-pay | Admitting: Physician Assistant

## 2011-04-09 VITALS — BP 133/83 | HR 95 | Wt 138.0 lb

## 2011-04-09 DIAGNOSIS — J329 Chronic sinusitis, unspecified: Secondary | ICD-10-CM

## 2011-04-09 DIAGNOSIS — Z23 Encounter for immunization: Secondary | ICD-10-CM

## 2011-04-09 DIAGNOSIS — R05 Cough: Secondary | ICD-10-CM

## 2011-04-09 MED ORDER — AMOXICILLIN-POT CLAVULANATE 875-125 MG PO TABS: 1.0000 | ORAL_TABLET | Freq: Two times a day (BID) | ORAL | Status: AC

## 2011-04-09 MED ORDER — HYDROCODONE-HOMATROPINE 5-1.5 MG/5ML PO SYRP: 5.0000 mL | ORAL_SOLUTION | Freq: Every evening | ORAL | Status: AC | PRN

## 2011-04-09 NOTE — Progress Notes (Signed)
Addended by: Ellsworth Lennox on: 04/09/2011 01:10 PM   Modules accepted: Orders

## 2011-04-09 NOTE — Patient Instructions (Signed)
Start Augmentin. Use hycodan at night for cough. Continue OTC cough medications during the day. Call if not improving on antibiotic.

## 2011-04-09 NOTE — Progress Notes (Signed)
  Subjective:    Patient ID: Jacob Kline, male    DOB: Jul 04, 1985, 26 y.o.   MRN: [id number]  HPI Patient is a 26 year old male that presents to the clinic today with a chief complaint of cough headache and nasal congestion this lasted for about 4 weeks. His first symptoms were cough that was not productive and has since developed into sinus congestion and pressure. She's tried Mucinex DM and it has helped but his headaches are getting worse. He is shot Robitussin and Delsym for his cough with no relief. The last week all of his symptoms have worsened. He denies fever, chills, nausea vomiting, shortness of breath, wheezing, and sore throat.   Review of Systems     Objective:   Physical Exam  Constitutional: He is oriented to person, place, and time. He appears well-developed and well-nourished. No distress.  HENT:  Head: Normocephalic and atraumatic.  Right Ear: External ear normal.  Left Ear: External ear normal.  Nose: Nose normal.  Mouth/Throat: Oropharynx is clear and moist. No oropharyngeal exudate.       With palpation of maxillary sinus and frontal sinus the patient reports relief.  Eyes: Right eye exhibits no discharge. Left eye exhibits no discharge.  Neck: Normal range of motion. Neck supple.  Cardiovascular: Normal rate, regular rhythm and normal heart sounds.   Pulmonary/Chest: Effort normal and breath sounds normal. He has no wheezes.  Lymphadenopathy:    He has no cervical adenopathy.  Neurological: He is alert and oriented to person, place, and time.  Skin: Skin is warm and dry. He is not diaphoretic.  Psychiatric: He has a normal mood and affect. His behavior is normal.          Assessment & Plan:  Sinusitis- I gave patient Augmentin since he did well on this prescription last sinus infection and infection resolved. Tylenol can be used for headaches. He remains on immunosuppressants; therefore, I told him to call the office if his symptoms were not improving  in the next 48 hours.  Cough- Gave patient Hycodan for cough to use only at bedtime. Instructed patient to use OTC cough medicines during the day.

## 2011-08-30 DIAGNOSIS — Z79899 Other long term (current) drug therapy: Secondary | ICD-10-CM | POA: Insufficient documentation

## 2012-07-03 ENCOUNTER — Encounter: Payer: Self-pay | Admitting: Family Medicine

## 2012-07-03 ENCOUNTER — Ambulatory Visit (INDEPENDENT_AMBULATORY_CARE_PROVIDER_SITE_OTHER): Payer: BC Managed Care – PPO | Admitting: Family Medicine

## 2012-07-03 VITALS — BP 121/84 | HR 62 | Temp 98.0°F | Wt 146.0 lb

## 2012-07-03 DIAGNOSIS — J029 Acute pharyngitis, unspecified: Secondary | ICD-10-CM

## 2012-07-03 DIAGNOSIS — J329 Chronic sinusitis, unspecified: Secondary | ICD-10-CM

## 2012-07-03 DIAGNOSIS — A499 Bacterial infection, unspecified: Secondary | ICD-10-CM

## 2012-07-03 LAB — POCT RAPID STREP A (OFFICE): Rapid Strep A Screen: NEGATIVE

## 2012-07-03 MED ORDER — AMOXICILLIN-POT CLAVULANATE 875-125 MG PO TABS: 1.0000 | ORAL_TABLET | Freq: Two times a day (BID) | ORAL | Status: AC

## 2012-07-03 MED ORDER — FLUTICASONE PROPIONATE 50 MCG/ACT NA SUSP
NASAL | Status: DC
Start: 2012-07-03 — End: 2012-11-23

## 2012-07-03 NOTE — Progress Notes (Signed)
CC: Jacob Kline is a 27 y.o. male is here for Sore Throat   Subjective: HPI: Patient complains of sore throat with swallowing, nasal congestion with thick green discharge, facial pressure below the eyes. Symptoms have been present for almost 3 weeks. Worsening on a daily basis. Has not responded to Allegra. No other interventions. Recently associated with subjective fever and fatigue. Symptoms are worse first thing in the morning. Nothing else makes better or worse. Symptoms are described as moderate in severity. Denies chills, night sweats, confusion, motor sensory disturbances, eye pain, photophobia, vision loss, dizziness, shortness of breath, cough, wheezing    Review Of Systems Outlined In HPI  Past Medical History  Diagnosis Date  . Myasthenia gravis     without exacerbation 358.00  . GERD (gastroesophageal reflux disease)      Family History  Problem Relation Age of Onset  . Hyperlipidemia Mother   . Hypertension Mother   . Hyperlipidemia Father   . Hypertension Father   . Cancer Maternal Grandmother   . Diabetes Maternal Grandmother      History  Substance Use Topics  . Smoking status: Former Games developer  . Smokeless tobacco: Not on file  . Alcohol Use: No     Objective: Filed Vitals:   07/03/12 1321  BP: 121/84  Pulse: 62  Temp: 98 F (36.7 C)    General: Alert and Oriented, No Acute Distress HEENT: Pupils equal, round, reactive to light. Conjunctivae clear.  External ears unremarkable, canals clear with intact TMs with appropriate landmarks. Bilateral serous effusion in the middle ear. Pink inferior turbinates.  Moist mucous membranes, pharynx with moderate cobblestoning and mild inflammation.  Neck supple without palpable lymphadenopathy nor abnormal masses. Lungs: Clear to auscultation bilaterally, no wheezing/ronchi/rales.  Comfortable work of breathing. Good air movement. Cardiac: Regular rate and rhythm. Normal S1/S2.  No murmurs, rubs, nor gallops.    Extremities: No peripheral edema.  Strong peripheral pulses.  Mental Status: No depression, anxiety, nor agitation. Skin: Warm and dry.  Assessment & Plan: Brendin was seen today for sore throat.  Diagnoses and associated orders for this visit:  Acute pharyngitis - POCT rapid strep A  Bacterial sinusitis - amoxicillin-clavulanate (AUGMENTIN) 875-125 MG per tablet; Take 1 tablet by mouth 2 (two) times daily. - fluticasone (FLONASE) 50 MCG/ACT nasal spray; Two sprays each nostril daily to prevent sinus allergies.  Other Orders - fexofenadine-pseudoephedrine (ALLEGRA-D 24) 180-240 MG per 24 hr tablet; Take 1 tablet by mouth daily.    Discussed treatment of bacterial sinusitis with Augmentin which he has tolerated in the past and addition of Flonase.Signs and symptoms requring emergent/urgent reevaluation were discussed with the patient.  Return if symptoms worsen or fail to improve.

## 2012-09-29 ENCOUNTER — Ambulatory Visit (INDEPENDENT_AMBULATORY_CARE_PROVIDER_SITE_OTHER): Payer: BC Managed Care – PPO | Admitting: Family Medicine

## 2012-09-29 ENCOUNTER — Encounter: Payer: Self-pay | Admitting: Family Medicine

## 2012-09-29 VITALS — BP 119/80 | HR 60 | Temp 98.0°F | Wt 150.0 lb

## 2012-09-29 DIAGNOSIS — H109 Unspecified conjunctivitis: Secondary | ICD-10-CM

## 2012-09-29 DIAGNOSIS — A499 Bacterial infection, unspecified: Secondary | ICD-10-CM

## 2012-09-29 DIAGNOSIS — B9689 Other specified bacterial agents as the cause of diseases classified elsewhere: Secondary | ICD-10-CM

## 2012-09-29 DIAGNOSIS — J329 Chronic sinusitis, unspecified: Secondary | ICD-10-CM

## 2012-09-29 MED ORDER — CEFDINIR 300 MG PO CAPS: 300.0000 mg | ORAL_CAPSULE | Freq: Two times a day (BID) | ORAL | Status: DC

## 2012-09-29 MED ORDER — POLYMYXIN B-TRIMETHOPRIM 10000-0.1 UNIT/ML-% OP SOLN: 1.0000 [drp] | OPHTHALMIC | Status: DC

## 2012-09-29 NOTE — Progress Notes (Signed)
CC: Jacob Kline is a 27 y.o. male is here for Sinusitis   Subjective: HPI:  Patient complains of facial pressure and nasal congestion described as moderate in severity localized between and above the eyes that has been present for 2 weeks worsening on a daily basis. Associated with drainage down the back of his throat mild cough and mild fatigue. Slight improvement with nasal steroid no other interventions. Symptoms are worse when leaning forward nothing else makes better or worse. Symptoms seem to be worse in the evening when lying down. He denies fevers, chills, motor or sensory disturbances, headache, cough, shortness of breath, wheezing, chest pain.  2 days ago he began having bloodshot eyes with grit like discharge. He denies photophobia or vision loss. He was using leftover Polytrim on a as-needed basis which would improve symptoms greatly for matter of hours.  Review Of Systems Outlined In HPI  Past Medical History  Diagnosis Date  . Myasthenia gravis     without exacerbation 358.00  . GERD (gastroesophageal reflux disease)      Family History  Problem Relation Age of Onset  . Hyperlipidemia Mother   . Hypertension Mother   . Hyperlipidemia Father   . Hypertension Father   . Cancer Maternal Grandmother   . Diabetes Maternal Grandmother      History  Substance Use Topics  . Smoking status: Former Games developer  . Smokeless tobacco: Not on file  . Alcohol Use: No     Objective: Filed Vitals:   09/29/12 1422  BP: 119/80  Pulse: 60  Temp: 98 F (36.7 C)    General: Alert and Oriented, No Acute Distress HEENT: Pupils equal, round, reactive to light. Mild conjunctival injection with clear anterior chambers.  External ears unremarkable, canals clear with intact TMs with appropriate landmarks.  Bilateral middle ear with serous effusion. Pink inferior turbinates.  Moist mucous membranes, pharynx without inflammation nor lesions.  Neck supple without palpable  lymphadenopathy nor abnormal masses. Frontal sinus tenderness to percussion Lungs: Clear to auscultation bilaterally, no wheezing/ronchi/rales.  Comfortable work of breathing. Good air movement. Cardiac: Regular rate and rhythm. Normal S1/S2.  No murmurs, rubs, nor gallops.   Extremities: No peripheral edema.  Strong peripheral pulses.  Mental Status: No depression, anxiety, nor agitation. Skin: Warm and dry.  Assessment & Plan: Jacob Kline was seen today for sinusitis.  Diagnoses and associated orders for this visit:  Conjunctivitis - trimethoprim-polymyxin b (POLYTRIM) ophthalmic solution; Place 1 drop into both eyes every 4 (four) hours. For ten days.  Bacterial sinusitis - cefdinir (OMNICEF) 300 MG capsule; Take 1 capsule (300 mg total) by mouth 2 (two) times daily.    Bacterial sinusitis: Start Omnicef reviewed myasthenia contraindicated antibiotics. Encouraged to use nasal steroid and saline washes Conjunctivitis: Polytrim he was using was over 72 years old therefore start new prescription for a total of 10 days of treatment  Return if symptoms worsen or fail to improve.

## 2012-11-23 ENCOUNTER — Encounter: Payer: Self-pay | Admitting: Family Medicine

## 2012-11-23 ENCOUNTER — Ambulatory Visit (INDEPENDENT_AMBULATORY_CARE_PROVIDER_SITE_OTHER): Payer: BC Managed Care – PPO | Admitting: Family Medicine

## 2012-11-23 VITALS — BP 118/76 | HR 66 | Wt 151.0 lb

## 2012-11-23 DIAGNOSIS — H109 Unspecified conjunctivitis: Secondary | ICD-10-CM

## 2012-11-23 DIAGNOSIS — B9689 Other specified bacterial agents as the cause of diseases classified elsewhere: Secondary | ICD-10-CM

## 2012-11-23 DIAGNOSIS — A499 Bacterial infection, unspecified: Secondary | ICD-10-CM

## 2012-11-23 DIAGNOSIS — J309 Allergic rhinitis, unspecified: Secondary | ICD-10-CM

## 2012-11-23 DIAGNOSIS — J329 Chronic sinusitis, unspecified: Secondary | ICD-10-CM

## 2012-11-23 MED ORDER — AZITHROMYCIN 1 % OP SOLN: OPHTHALMIC | Status: DC

## 2012-11-23 MED ORDER — FLUTICASONE PROPIONATE 50 MCG/ACT NA SUSP: NASAL | Status: DC

## 2012-11-23 NOTE — Patient Instructions (Signed)

## 2012-11-23 NOTE — Progress Notes (Signed)
  Subjective:    Patient ID: Jacob Kline, male    DOB: 09/09/1985, 27 y.o.   MRN: [id number]  HPI  L eye started this AM. pt states that whenever he has problems w/sinuses he tends to get pink eye. Says woke up with it this AM with redness, goo in the corner and some crusting.  Had a sinus infection with conjunctivitis back in July. Then had a 2nd episode of pink eye a few weeks later. Used left over drops to clear this up.  Using Allegra PRN and was using spray when needed. Has has nasal congestion, sneezing.  Has been using the netti-pot.  No vison changes or vision loss. He has really only started having problems with allergies over the last couple years. He was diagnosed with myasthenia gravis about 5-6 years ago.   Review of Systems     Objective:   Physical Exam  Constitutional: He is oriented to person, place, and time. He appears well-developed and well-nourished.  HENT:  Head: Normocephalic and atraumatic.  Right Ear: External ear normal.  Left Ear: External ear normal.  Nose: Nose normal.  Mouth/Throat: Oropharynx is clear and moist.  TMs and canals are clear.   Eyes: Conjunctivae and EOM are normal. Pupils are equal, round, and reactive to light.  Left sclera injected. No crusting or drianage.   Neck: Neck supple. No thyromegaly present.  Cardiovascular: Normal rate and normal heart sounds.   Pulmonary/Chest: Effort normal and breath sounds normal.  Lymphadenopathy:    He has no cervical adenopathy.  Neurological: He is alert and oriented to person, place, and time.  Skin: Skin is warm and dry.  Psychiatric: He has a normal mood and affect.          Assessment & Plan:  Conjunctivitis of the left eye-will treat with azithromycin eyedrops. Because of his myasthenia gravis he is supposed to stay away from back lites but he says he is willing to try it since his myasthenia gravis is well controlled and the drops are primarily topical. If he has any problems then we  can send in the Polytrim.  Allergic rhinitis-restart Allegra and Flonase. Prescription sent for Flonase. If she's not improving over the next 4-5 days then consider treatment with an oral antibiotic for sinus infection.

## 2012-11-24 ENCOUNTER — Telehealth: Payer: Self-pay | Admitting: *Deleted

## 2012-11-24 DIAGNOSIS — H109 Unspecified conjunctivitis: Secondary | ICD-10-CM

## 2012-11-24 MED ORDER — POLYMYXIN B-TRIMETHOPRIM 10000-0.1 UNIT/ML-% OP SOLN: 1.0000 [drp] | OPHTHALMIC | Status: DC

## 2012-11-24 NOTE — Telephone Encounter (Signed)
New rx sent

## 2012-11-24 NOTE — Telephone Encounter (Signed)
Pt notified med sent to pharmacy. Breunna Nordmann, LPN  

## 2012-11-24 NOTE — Telephone Encounter (Signed)
Pt calls and states the eye drops you prescribed are too expensive and would like cheaper ones sent to his pharmacy. Barry Dienes, LPN

## 2012-12-14 ENCOUNTER — Telehealth: Payer: Self-pay | Admitting: *Deleted

## 2012-12-14 DIAGNOSIS — H109 Unspecified conjunctivitis: Secondary | ICD-10-CM

## 2012-12-14 MED ORDER — AZITHROMYCIN 1 % OP SOLN: OPHTHALMIC | Status: DC

## 2012-12-14 NOTE — Telephone Encounter (Signed)
Pt notified of rx & is ok with ophthalmologist.

## 2012-12-14 NOTE — Telephone Encounter (Signed)
Referral placed.

## 2012-12-14 NOTE — Telephone Encounter (Signed)
Pt calls & states that u dx him with pink eye & he chose to use the eye drops he had before.  He states that the redness went away but the discharge did not.  Pt states that it is red again now & wants to know if you can send in the original drops that you were going give [facility] to [facility].

## 2012-12-14 NOTE — Telephone Encounter (Signed)
New prescription sent to pharmacy. Let him know that I still just a think this is normal for him to get this many infections. I would actually like her for him to an ophthalmologist as well. Please let me know if she's okay with that.

## 2013-04-12 ENCOUNTER — Encounter: Payer: Self-pay | Admitting: Family Medicine

## 2013-04-12 ENCOUNTER — Ambulatory Visit (INDEPENDENT_AMBULATORY_CARE_PROVIDER_SITE_OTHER): Payer: BC Managed Care – PPO | Admitting: Family Medicine

## 2013-04-12 VITALS — BP 107/68 | HR 75 | Temp 98.1°F | Wt 159.0 lb

## 2013-04-12 DIAGNOSIS — J069 Acute upper respiratory infection, unspecified: Secondary | ICD-10-CM

## 2013-04-12 NOTE — Progress Notes (Signed)
   Subjective:    Patient ID: Jacob Kline, male    DOB: 1986/01/02, 28 y.o.   MRN: [id number]  HPI Cough, ST, and nasal congestion and fatigue x 1 week. Low grade temp intiially. Some loose stools. No vomiting.  ST is wrose in the AM.  Taking [facility] brand of Nyquail and Dayquail.    Review of Systems     Objective:   Physical Exam  Constitutional: He is oriented to person, place, and time. He appears well-developed and well-nourished.  HENT:  Head: Normocephalic and atraumatic.  Right Ear: External ear normal.  Left Ear: External ear normal.  Nose: Nose normal.  Mouth/Throat: Oropharynx is clear and moist.  TMs and canals are clear.   Eyes: Conjunctivae and EOM are normal. Pupils are equal, round, and reactive to light.  Neck: Neck supple. No thyromegaly present.  Cardiovascular: Normal rate and normal heart sounds.   Pulmonary/Chest: Effort normal and breath sounds normal.  Lymphadenopathy:    He has no cervical adenopathy.  Neurological: He is alert and oriented to person, place, and time.  Skin: Skin is warm and dry.  Psychiatric: He has a normal mood and affect.          Assessment & Plan:  Acute URI - likely viral. Call if not better in one week if if suddenly gets worse. Continue symptomatic care. reassurnace given.

## 2013-04-12 NOTE — Patient Instructions (Signed)

## 2014-01-30 ENCOUNTER — Encounter: Payer: Self-pay | Admitting: Family Medicine

## 2014-01-30 ENCOUNTER — Ambulatory Visit (INDEPENDENT_AMBULATORY_CARE_PROVIDER_SITE_OTHER): Payer: BC Managed Care – PPO | Admitting: Family Medicine

## 2014-01-30 VITALS — BP 119/67 | HR 70 | Temp 98.1°F | Wt 147.0 lb

## 2014-01-30 DIAGNOSIS — J0100 Acute maxillary sinusitis, unspecified: Secondary | ICD-10-CM

## 2014-01-30 DIAGNOSIS — J029 Acute pharyngitis, unspecified: Secondary | ICD-10-CM

## 2014-01-30 LAB — POCT RAPID STREP A (OFFICE): Rapid Strep A Screen: NEGATIVE

## 2014-01-30 MED ORDER — SULFAMETHOXAZOLE-TRIMETHOPRIM 800-160 MG PO TABS: 1.0000 | ORAL_TABLET | Freq: Two times a day (BID) | ORAL | Status: DC

## 2014-01-30 NOTE — Patient Instructions (Signed)

## 2014-01-30 NOTE — Progress Notes (Signed)
   Subjective:    Patient ID: Jacob HumbleChristopher D Kline, male    DOB: March 16, 1986, 28 y.o.   MRN: [id number]  HPI  One week of sinus congestion, facial pressure underneath both eyes, cough, sore throat times one week. He says he woke up feeling worse today. He's getting green congestion from his nose. No fevers chills or sweats. He's been using DayQuil and I quell for some dramatic relief. He has a history of myasthenia gravis and is currently on CellCept.he denies any chest pain or shortness of breath.  Review of Systems     Objective:   Physical Exam  Constitutional: He is oriented to person, place, and time. He appears well-developed and well-nourished.  HENT:  Head: Normocephalic and atraumatic.  Right Ear: External ear normal.  Left Ear: External ear normal.  Nose: Nose normal.  Mouth/Throat: Oropharynx is clear and moist.  TMs and canals are clear. Some cobblestone of the posterior pharynx.  Eyes: Conjunctivae and EOM are normal. Pupils are equal, round, and reactive to light.  Neck: Neck supple. No thyromegaly present.  Cardiovascular: Normal rate and normal heart sounds.   Pulmonary/Chest: Effort normal and breath sounds normal.  Lymphadenopathy:    He has no cervical adenopathy.  Neurological: He is alert and oriented to person, place, and time.  Skin: Skin is warm and dry.  Psychiatric: He has a normal mood and affect.          Assessment & Plan:  Acute sinusitis-will treat with Bactrim. It does not seem to be on his list of contraindicated medications because of his myasthenia gravis. If he's not significantly better in one week and please give us a call. Okay to continue symptomatically medications.

## 2014-01-30 NOTE — Addendum Note (Signed)
Addended by: Chalmers CaterUTTLE, [place] Spainhower H on: 01/30/2014 09:53 AM   Modules accepted: Orders

## 2014-06-19 ENCOUNTER — Ambulatory Visit (INDEPENDENT_AMBULATORY_CARE_PROVIDER_SITE_OTHER): Payer: BC Managed Care – PPO | Admitting: Physician Assistant

## 2014-06-19 ENCOUNTER — Encounter: Payer: Self-pay | Admitting: Physician Assistant

## 2014-06-19 VITALS — BP 124/81 | HR 74 | Temp 98.5°F | Wt 154.0 lb

## 2014-06-19 DIAGNOSIS — J029 Acute pharyngitis, unspecified: Secondary | ICD-10-CM | POA: Diagnosis not present

## 2014-06-19 LAB — POCT RAPID STREP A (OFFICE): Rapid Strep A Screen: NEGATIVE

## 2014-06-19 MED ORDER — HYDROCODONE-HOMATROPINE 5-1.5 MG/5ML PO SYRP: 5.0000 mL | ORAL_SOLUTION | Freq: Every evening | ORAL | Status: DC | PRN

## 2014-06-19 MED ORDER — SULFAMETHOXAZOLE-TRIMETHOPRIM 800-160 MG PO TABS: 1.0000 | ORAL_TABLET | Freq: Two times a day (BID) | ORAL | Status: DC

## 2014-06-19 NOTE — Patient Instructions (Signed)
allergra add flonase 2 sprays each nostril daily.   Pharyngitis Pharyngitis is redness, pain, and swelling (inflammation) of your pharynx.  CAUSES  Pharyngitis is usually caused by infection. Most of the time, these infections are from viruses (viral) and are part of a cold. However, sometimes pharyngitis is caused by bacteria (bacterial). Pharyngitis can also be caused by allergies. Viral pharyngitis may be spread from person to person by coughing, sneezing, and personal items or utensils (cups, forks, spoons, toothbrushes). Bacterial pharyngitis may be spread from person to person by more intimate contact, such as kissing.  SIGNS AND SYMPTOMS  Symptoms of pharyngitis include:   Sore throat.   Tiredness (fatigue).   Low-grade fever.   Headache.  Joint pain and muscle aches.  Skin rashes.  Swollen lymph nodes.  Plaque-like film on throat or tonsils (often seen with bacterial pharyngitis). DIAGNOSIS  Your health care provider will ask you questions about your illness and your symptoms. Your medical history, along with a physical exam, is often all that is needed to diagnose pharyngitis. Sometimes, a rapid strep test is done. Other lab tests may also be done, depending on the suspected cause.  TREATMENT  Viral pharyngitis will usually get better in 3-4 days without the use of medicine. Bacterial pharyngitis is treated with medicines that kill germs (antibiotics).  HOME CARE INSTRUCTIONS   Drink enough water and fluids to keep your urine clear or pale yellow.   Only take over-the-counter or prescription medicines as directed by your health care provider:   If you are prescribed antibiotics, make sure you finish them even if you start to feel better.   Do not take aspirin.   Get lots of rest.   Gargle with 8 oz of salt water ( tsp of salt per 1 qt of water) as often as every 1-2 hours to soothe your throat.   Throat lozenges (if you are not at risk for choking) or  sprays may be used to soothe your throat. SEEK MEDICAL CARE IF:   You have large, tender lumps in your neck.  You have a rash.  You cough up green, yellow-brown, or bloody spit. SEEK IMMEDIATE MEDICAL CARE IF:   Your neck becomes stiff.  You drool or are unable to swallow liquids.  You vomit or are unable to keep medicines or liquids down.  You have severe pain that does not go away with the use of recommended medicines.  You have trouble breathing (not caused by a stuffy nose). MAKE SURE YOU:   Understand these instructions.  Will watch your condition.  Will get help right away if you are not doing well or get worse. Document Released: 03/08/2005 Document Revised: 12/27/2012 Document Reviewed: 11/13/2012 [facility] Patient Information 2015 [place], [place]. This information is not intended to replace advice given to you by your health care provider. Make sure you discuss any questions you have with your health care provider.

## 2014-06-19 NOTE — Progress Notes (Signed)
   Subjective:    Patient ID: Jacob HumbleChristopher D Vandam, male    DOB: May 22, 1985, 29 y.o.   MRN: [id number]  HPI  Pt presents to the clinic with ST for last 3 days. 2 of his daughters have had strep throat in the last 2 weeks. He does have dry cough. No fever. ST worse in morning and at night. Has had tonsils removed. No fever, chills, sinus pressure, wheezing. Not tried anything OTC. On medication that decrease immune system. Pt has hx of allergies.      Review of Systems  All other systems reviewed and are negative.      Objective:   Physical Exam  Constitutional: He is oriented to person, place, and time. He appears well-developed and well-nourished.  HENT:  Head: Normocephalic and atraumatic.  Right Ear: External ear normal.  Left Ear: External ear normal.  TM's slightly erythematous.   Negative maxillary and/or frontal sinus tenderness to palpation.   No tonsils. PND present. No exudate. Oropharynx slightly erythematous.   Eyes: Conjunctivae are normal. Right eye exhibits no discharge. Left eye exhibits no discharge.  Neck: Normal range of motion. Neck supple.  Cardiovascular: Normal rate, regular rhythm and normal heart sounds.   Pulmonary/Chest: Effort normal and breath sounds normal. He has no wheezes.  Lymphadenopathy:    He has no cervical adenopathy.  Neurological: He is alert and oriented to person, place, and time.  Skin: Skin is dry.  Psychiatric: He has a normal mood and affect. His behavior is normal.          Assessment & Plan:  Acute pharyngitis- rapid strep negative. Had tonsils removed. No signs of strep on physical exam. I do think symptoms are more allergic. Restart allegra and flonase. Consider humidifer in room at night. Salt water gargles and ibuprofen. If not improving due to lower immune system and drugs given bactrim to take. For cough given hycodan at bedtime.

## 2014-06-25 ENCOUNTER — Encounter: Payer: Self-pay | Admitting: Physician Assistant

## 2014-06-25 ENCOUNTER — Other Ambulatory Visit: Payer: Self-pay | Admitting: *Deleted

## 2014-06-25 ENCOUNTER — Ambulatory Visit (INDEPENDENT_AMBULATORY_CARE_PROVIDER_SITE_OTHER): Payer: BC Managed Care – PPO

## 2014-06-25 ENCOUNTER — Ambulatory Visit (INDEPENDENT_AMBULATORY_CARE_PROVIDER_SITE_OTHER): Payer: BC Managed Care – PPO | Admitting: Physician Assistant

## 2014-06-25 VITALS — BP 114/79 | HR 71 | Temp 98.6°F | Wt 151.0 lb

## 2014-06-25 DIAGNOSIS — R6889 Other general symptoms and signs: Secondary | ICD-10-CM

## 2014-06-25 DIAGNOSIS — R0989 Other specified symptoms and signs involving the circulatory and respiratory systems: Secondary | ICD-10-CM

## 2014-06-25 DIAGNOSIS — R059 Cough, unspecified: Secondary | ICD-10-CM

## 2014-06-25 DIAGNOSIS — B349 Viral infection, unspecified: Secondary | ICD-10-CM | POA: Diagnosis not present

## 2014-06-25 DIAGNOSIS — R110 Nausea: Secondary | ICD-10-CM

## 2014-06-25 DIAGNOSIS — R05 Cough: Secondary | ICD-10-CM

## 2014-06-25 DIAGNOSIS — R509 Fever, unspecified: Secondary | ICD-10-CM | POA: Diagnosis not present

## 2014-06-25 LAB — POCT INFLUENZA A/B
Influenza A, POC: NEGATIVE
Influenza B, POC: NEGATIVE

## 2014-06-25 MED ORDER — ONDANSETRON HCL 4 MG PO TABS: 4.0000 mg | ORAL_TABLET | Freq: Four times a day (QID) | ORAL | Status: DC

## 2014-06-25 MED ORDER — ONDANSETRON HCL 4 MG PO TABS: 4.0000 mg | ORAL_TABLET | Freq: Three times a day (TID) | ORAL | Status: DC | PRN

## 2014-06-25 NOTE — Telephone Encounter (Signed)
Patient called asking for something for nausea forgot to ask doing appt

## 2014-06-25 NOTE — Telephone Encounter (Signed)
Ok for zofran 4mg  I po q-6 hours as needed for nausea #20 NRF

## 2014-06-25 NOTE — Progress Notes (Signed)
   Subjective:    Patient ID: Jacob Kline, male    DOB: [birth year], 29 y.o.   MRN: [id number]  HPI  Pt presents to the clinic with persistent symptoms. He was seen 06/19/14 with ST. Rapid strep negative. Suspected viral. Given bactrim he did not take orginally. He was feeling better and then Sunday evening he had body aches, fever of 102, diarrhea. He continues to feel worn down, nauseated, tired, and dry cough. Not taking anything except bactrim and does not feel any better. No sinus pressure, nasal congestion, or ear pain.    Review of Systems  All other systems reviewed and are negative.      Objective:   Physical Exam  Constitutional: He is oriented to person, place, and time. He appears well-developed and well-nourished.  HENT:  Head: Normocephalic and atraumatic.  Cardiovascular: Normal rate, regular rhythm and normal heart sounds.   Pulmonary/Chest: Effort normal and breath sounds normal.  Neurological: He is alert and oriented to person, place, and time.  Skin: Skin is dry.  Psychiatric: He has a normal mood and affect. His behavior is normal.          Assessment & Plan:  Viral illness/cough/flu like symptoms- pt is on cellcept so more concern with being immunosuppressed. Will get CXR today with CBC. Ok to finish bactrim but with no improvement I do think viral. Could even be allergic and body responding with more symptoms being immunosuppressed. Hycodan given for cough at bedtime. Discussed rest and hydration. During day for cough try delsym.   Pt called back and complained of nausea sent zofran to pharmacy.   CXR was normal.

## 2014-06-25 NOTE — Patient Instructions (Addendum)
delslym for cough during the day.    Influenza Influenza ("the flu") is a viral infection of the respiratory tract. It occurs more often in winter months because people spend more time in close contact with one another. Influenza can make you feel very sick. Influenza easily spreads from person to person (contagious). CAUSES  Influenza is caused by a virus that infects the respiratory tract. You can catch the virus by breathing in droplets from an infected person's cough or sneeze. You can also catch the virus by touching something that was recently contaminated with the virus and then touching your mouth, nose, or eyes. RISKS AND COMPLICATIONS You may be at risk for a more severe case of influenza if you smoke cigarettes, have diabetes, have chronic heart disease (such as heart failure) or lung disease (such as asthma), or if you have a weakened immune system. Elderly people and pregnant women are also at risk for more serious infections. The most common problem of influenza is a lung infection (pneumonia). Sometimes, this problem can require emergency medical care and may be life threatening. SIGNS AND SYMPTOMS  Symptoms typically last 4 to 10 days and may include:  Fever.  Chills.  Headache, body aches, and muscle aches.  Sore throat.  Chest discomfort and cough.  Poor appetite.  Weakness or feeling tired.  Dizziness.  Nausea or vomiting. DIAGNOSIS  Diagnosis of influenza is often made based on your history and a physical exam. A nose or throat swab test can be done to confirm the diagnosis. TREATMENT  In mild cases, influenza goes away on its own. Treatment is directed at relieving symptoms. For more severe cases, your health care provider may prescribe antiviral medicines to shorten the sickness. Antibiotic medicines are not effective because the infection is caused by a virus, not by bacteria. HOME CARE INSTRUCTIONS  Take medicines only as directed by your health care  provider.  Use a cool mist humidifier to make breathing easier.  Get plenty of rest until your temperature returns to normal. This usually takes 3 to 4 days.  Drink enough fluid to keep your urine clear or pale yellow.  Cover yourmouth and nosewhen coughing or sneezing,and wash your handswellto prevent thevirusfrom spreading.  Stay homefromwork orschool untilthe fever is gonefor at least 631full day. PREVENTION  An annual influenza vaccination (flu shot) is the best way to avoid getting influenza. An annual flu shot is now routinely recommended for all adults in the [place] SEEK MEDICAL CARE IF:  You experiencechest pain, yourcough worsens,or you producemore mucus.  Youhave nausea,vomiting, ordiarrhea.  Your fever returns or gets worse. SEEK IMMEDIATE MEDICAL CARE IF:  You havetrouble breathing, you become short of breath,or your skin ornails becomebluish.  You have severe painor stiffnessin the neck.  You develop a sudden headache, or pain in the face or ear.  You have nausea or vomiting that you cannot control. MAKE SURE YOU:   Understand these instructions.  Will watch your condition.  Will get help right away if you are not doing well or get worse. Document Released: 03/05/2000 Document Revised: 07/23/2013 Document Reviewed: 06/07/2011 [facility] Patient Information 2015 [place], [place]. This information is not intended to replace advice given to you by your health care provider. Make sure you discuss any questions you have with your health care provider.

## 2014-06-26 LAB — CBC WITH DIFFERENTIAL/PLATELET
Basophils Absolute: 0 10*3/uL (ref 0.0–0.1)
Basophils Relative: 0 % (ref 0–1)
EOS ABS: 0.1 10*3/uL (ref 0.0–0.7)
EOS PCT: 1 % (ref 0–5)
HEMATOCRIT: 50.6 % (ref 39.0–52.0)
HEMOGLOBIN: 17.9 g/dL — AB (ref 13.0–17.0)
LYMPHS ABS: 1.3 10*3/uL (ref 0.7–4.0)
Lymphocytes Relative: 20 % (ref 12–46)
MCH: 28.4 pg (ref 26.0–34.0)
MCHC: 35.4 g/dL (ref 30.0–36.0)
MCV: 80.2 fL (ref 78.0–100.0)
MONO ABS: 0.9 10*3/uL (ref 0.1–1.0)
MONOS PCT: 14 % — AB (ref 3–12)
Neutro Abs: 4.1 10*3/uL (ref 1.7–7.7)
Neutrophils Relative %: 65 % (ref 43–77)
Platelets: 145 10*3/uL — ABNORMAL LOW (ref 150–400)
RBC: 6.31 MIL/uL — ABNORMAL HIGH (ref 4.22–5.81)
RDW: 14 % (ref 11.5–15.5)
WBC: 6.3 10*3/uL (ref 4.0–10.5)

## 2014-07-04 ENCOUNTER — Telehealth: Payer: Self-pay | Admitting: *Deleted

## 2014-07-04 DIAGNOSIS — R05 Cough: Secondary | ICD-10-CM

## 2014-07-04 DIAGNOSIS — R059 Cough, unspecified: Secondary | ICD-10-CM

## 2014-07-04 DIAGNOSIS — R6889 Other general symptoms and signs: Secondary | ICD-10-CM

## 2014-07-04 NOTE — Telephone Encounter (Signed)
Patient called in stating that he has been seen over the last few weeks for  ? Strept  & ? Flu. Both test where (-) negative. He has just finished his  10 day cycle of antibiotics & he still isn't feeling well  & is still having fevers daily. Wondering does he need to come back in for a visit or just wait it out?

## 2014-07-04 NOTE — Telephone Encounter (Signed)
Spoke with Patient & he is having problems with congestion  & bad cough (coughing while on phone) also starting to have headaches he stated that feels  like sinus pressure. Fever @ least 1 x daily between 100.0 -100.5. Patient states cough is persistent all day long, and that he has tried Delysn   with no relief. Does have runny nose but that the nausea is gone & he is able to eat.  No problems with urine. ** Patient stated he had a Vasectomy on 4/9 & told doctor about the fevers. Since no  fever present @ the time doctor went ahead with procedure.**

## 2014-07-04 NOTE — Telephone Encounter (Signed)
What are fevers running? We have not captured any in office.  Are you having any other symptoms than fever at this time other than weakness and not feeling good?  Urine, cough/wheezing, running nose etc.

## 2014-07-05 ENCOUNTER — Ambulatory Visit (INDEPENDENT_AMBULATORY_CARE_PROVIDER_SITE_OTHER): Payer: BC Managed Care – PPO

## 2014-07-05 ENCOUNTER — Ambulatory Visit (INDEPENDENT_AMBULATORY_CARE_PROVIDER_SITE_OTHER): Payer: BC Managed Care – PPO | Admitting: Family Medicine

## 2014-07-05 ENCOUNTER — Encounter: Payer: Self-pay | Admitting: Family Medicine

## 2014-07-05 VITALS — BP 112/69 | HR 116 | Temp 99.3°F | Wt 148.0 lb

## 2014-07-05 DIAGNOSIS — R112 Nausea with vomiting, unspecified: Secondary | ICD-10-CM

## 2014-07-05 DIAGNOSIS — R05 Cough: Secondary | ICD-10-CM

## 2014-07-05 DIAGNOSIS — R059 Cough, unspecified: Secondary | ICD-10-CM

## 2014-07-05 DIAGNOSIS — R509 Fever, unspecified: Secondary | ICD-10-CM

## 2014-07-05 DIAGNOSIS — J189 Pneumonia, unspecified organism: Secondary | ICD-10-CM

## 2014-07-05 DIAGNOSIS — J188 Other pneumonia, unspecified organism: Secondary | ICD-10-CM

## 2014-07-05 DIAGNOSIS — R6889 Other general symptoms and signs: Secondary | ICD-10-CM

## 2014-07-05 MED ORDER — PROMETHAZINE HCL 25 MG/ML IJ SOLN
25.0000 mg | Freq: Once | INTRAMUSCULAR | Status: AC
  Administered 2014-07-05: 25 mg via INTRAMUSCULAR

## 2014-07-05 MED ORDER — DOXYCYCLINE HYCLATE 100 MG PO TABS: 100.0000 mg | ORAL_TABLET | Freq: Two times a day (BID) | ORAL | Status: DC

## 2014-07-05 MED ORDER — BENZONATATE 200 MG PO CAPS: 200.0000 mg | ORAL_CAPSULE | Freq: Two times a day (BID) | ORAL | Status: DC | PRN

## 2014-07-05 MED ORDER — HYDROCODONE-HOMATROPINE 5-1.5 MG/5ML PO SYRP: 5.0000 mL | ORAL_SOLUTION | Freq: Every evening | ORAL | Status: DC | PRN

## 2014-07-05 NOTE — Telephone Encounter (Signed)
Since cough has started come in and get CXR and mono and ESR. Can send in tessalon pearle and hycodan for cough. If CXR normal my consider a prednisone taper. Get done today. Discussed plan with Dr. Madilyn Fireman and she agrees. You have complete abx and WBC good bacterial infection is not likely.

## 2014-07-05 NOTE — Addendum Note (Signed)
Addended by: Collie SiadICHARDSON, Khameron Gruenwald M on: 07/05/2014 10:56 AM   Modules accepted: Orders

## 2014-07-05 NOTE — Telephone Encounter (Signed)
Spoke with Pt, he will come in today for CXR and lab work. Advised of new Rx being sent to pharmacy. Verbalized understanding.

## 2014-07-05 NOTE — Progress Notes (Signed)
Subjective:    Patient ID: Jacob Kline, male    DOB: Oct 19, 1985, 29 y.o.   MRN: [id number]  HPI 29 year old male with a history of myasthenia gravis. Cough, SOB x 3 weeks.  ST initially but that is better. Says feels like cough is wet but can't get up it.  Fever up to 103.1.  Vomited this AM after feling nauseated.  Not after coughing.  No urinary sxs.  Did complete a 10 day course of BActrim.  [place] recent outside the country.  Did have some diarrhea but thought related to the ABX. No swollen LN.  No ear pain.  + HA.  Daughter has been sick at home with a cough.     Review of Systems  BP 112/69 mmHg  Pulse 116  Temp(Src) 99.3 F (37.4 C)  Wt 148 lb (67.132 kg)  SpO2 95%    Allergies  Allergen Reactions  . Aminoglycosides     Due to Myasthenia Gravis  . Avelox [Moxifloxacin Hcl In Nacl]     Due to Myasthenia Gravis   . Beta Adrenergic Blockers     Due to Myasthenia Gravis  . Botulinum Toxins     Due to Myasthenia Gravis   . Calcium Channel Blockers     Due to Myasthenia Gravis   . Ciprofloxacin     Due to Myasthenia Gravis   . Factive [Gemifloxacin Mesylate]     Due to Myasthenia Gravis   . Floxin [Ocuflox]     Due to Myasthenia Gravis   . Interferons     Due to Myasthenia Gravis   . Levofloxacin     Due to Myasthenia Gravis   . Macrolides And Ketolides     Due to Myasthenia Gravis   . Magnesium-Containing Compounds     Milk of mag,some antacids (like maalox or mylanta)  Due to Myasthenia Gravis   . Noroxin [Norfloxacin]     Due to Myasthenia Gravis   . Penicillamine   . Proquin Xr [Ciprofloxacin Hcl]     Due to Myasthenia Gravis   . Quinine Derivatives     Due to Myasthenia Gravis     Past Medical History  Diagnosis Date  . Myasthenia gravis     without exacerbation 358.00  . GERD (gastroesophageal reflux disease)     Past Surgical History  Procedure Laterality Date  . Tonsillectomy    . Tibia and fibia fracture  2003    RT  .  Thymectomy  05-2007    History   Social History  . Marital Status: Married    Spouse Name: N/A  . Number of Children: N/A  . Years of Education: N/A   Occupational History  . Not on file.   Social History Main Topics  . Smoking status: Former Games developer  . Smokeless tobacco: Not on file  . Alcohol Use: No  . Drug Use: No  . 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    Other Topics Concern  . Not on file   Social History Narrative    Family History  Problem Relation Age of Onset  . Hyperlipidemia Mother   . Hypertension Mother   . Hyperlipidemia Father   . Hypertension Father   . Cancer Maternal Grandmother   . Diabetes Maternal Grandmother     Outpatient Encounter Prescriptions as of 07/05/2014  Medication Sig  . benzonatate (TESSALON) 200 MG capsule Take 1  capsule (200 mg total) by mouth 2 (two) times daily as needed for cough.  Marland Kitchen. HYDROcodone-homatropine (HYCODAN) 5-1.5 MG/5ML syrup Take 5 mLs by mouth at bedtime as needed.  . mycophenolate (CELLCEPT) 250 MG capsule Take 250 mg by mouth daily.  . ondansetron (ZOFRAN) 4 MG tablet Take 1 tablet (4 mg total) by mouth every 6 (six) hours.  Marland Kitchen. doxycycline (VIBRA-TABS) 100 MG tablet Take 1 tablet (100 mg total) by mouth 2 (two) times daily.  . [DISCONTINUED] sulfamethoxazole-trimethoprim (BACTRIM DS,SEPTRA DS) 800-160 MG per tablet Take 1 tablet by mouth 2 (two) times daily.  . [EXPIRED] promethazine (PHENERGAN) injection 25 mg           Objective:   Physical Exam  Constitutional: He is oriented to person, place, and time. He appears well-developed and well-nourished.  HENT:  Head: Normocephalic and atraumatic.  Right Ear: External ear normal.  Left Ear: External ear normal.  Nose: Nose normal.  Mouth/Throat: Oropharynx is clear and moist.  TMs and canals are clear.   Eyes: Conjunctivae and EOM are normal. Pupils are equal, round,  and reactive to light.  Neck: Neck supple. No thyromegaly present.  Cardiovascular: Normal rate, regular rhythm and normal heart sounds.   Pulmonary/Chest: Effort normal and breath sounds normal.  Lymphadenopathy:    He has no cervical adenopathy.  Neurological: He is alert and oriented to person, place, and time.  Skin: Skin is warm and dry.  Psychiatric: He has a normal mood and affect. His behavior is normal.          Assessment & Plan:  Left pneumonia - [facility]. He should be able to tolerate doxycycline. We will start with doxycycline her milligrams twice a day. Try to get 2 doses in today. He did vomit 3 times this morning and took a Zofran. Since then he has been able to keep things down including a dose of ibuprofen that he took earlier today. He did start to feel nauseated again here in the office and we gave him an injection of Phenergan. If he is able to keep the medication down then encouraged him to try for at least 2 days. If at that point he is not improving then he may need to go the hospital for IV antibiotics. Certainly if he starts vomiting again and is not able to stay hydrated and he needs to go to the hospital sooner for IV hydration and IV antibiotics.

## 2014-07-06 LAB — SEDIMENTATION RATE: SED RATE: 4 mm/h (ref 0–15)

## 2014-07-08 LAB — CMV IGM: CMV IgM: <8 AU/mL (ref <30.00)

## 2014-07-08 LAB — EPSTEIN-BARR VIRUS VCA, IGM: EBV VCA IgM: <10 U/mL (ref <36.0)

## 2014-07-08 LAB — EPSTEIN-BARR VIRUS VCA, IGG: EBV VCA IgG: 272 U/mL — ABNORMAL HIGH (ref <18.0)

## 2014-07-12 ENCOUNTER — Ambulatory Visit: Payer: BC Managed Care – PPO | Admitting: Family Medicine

## 2014-07-16 ENCOUNTER — Ambulatory Visit (INDEPENDENT_AMBULATORY_CARE_PROVIDER_SITE_OTHER): Payer: BC Managed Care – PPO

## 2014-07-16 ENCOUNTER — Ambulatory Visit (INDEPENDENT_AMBULATORY_CARE_PROVIDER_SITE_OTHER): Payer: BC Managed Care – PPO | Admitting: Physician Assistant

## 2014-07-16 ENCOUNTER — Encounter: Payer: Self-pay | Admitting: Physician Assistant

## 2014-07-16 VITALS — BP 126/85 | HR 73 | Temp 98.0°F | Resp 18 | Ht 65.0 in | Wt 145.0 lb

## 2014-07-16 DIAGNOSIS — J189 Pneumonia, unspecified organism: Secondary | ICD-10-CM | POA: Diagnosis not present

## 2014-07-16 DIAGNOSIS — J181 Lobar pneumonia, unspecified organism: Principal | ICD-10-CM

## 2014-07-16 NOTE — Progress Notes (Signed)
   Subjective:    Patient ID: Jacob Kline, male    DOB: 08/23/1985, 29 y.o.   MRN: [id number]  HPI Pt presents to the clinic to follow up on lower left lobe pneumonia. He has a hx of myasthenia gravis and on cellcept. He does not tolerate multiple abx. He was put on doxycycline and he is feeling much better. Finished his doxycycline on Sunday. He still has some SOB with exertion and a productive cough. No wheezing. Sleeping fine at night. Not using anything for cough. Every day feeling better and better.    Review of Systems  All other systems reviewed and are negative.      Objective:   Physical Exam  Constitutional: He is oriented to person, place, and time. He appears well-developed and well-nourished.  HENT:  Head: Normocephalic and atraumatic.  Cardiovascular: Normal rate, regular rhythm and normal heart sounds.   Pulmonary/Chest: Effort normal.  I still heard some crackles and rhonchi at base of left lung.  No wheezing.   Neurological: He is alert and oriented to person, place, and time.  Skin: Skin is dry.  Psychiatric: He has a normal mood and affect. His behavior is normal.          Assessment & Plan:  Left lower lobe pneumonia- will get follow up cXR. 2nd pulse ox was 96 percent. Discussed some SOB after pneumonia is to be expected as healing. Follow up if not continuing to get better.

## 2014-07-24 ENCOUNTER — Encounter: Payer: Self-pay | Admitting: Physician Assistant

## 2014-07-24 ENCOUNTER — Other Ambulatory Visit: Payer: Self-pay | Admitting: Physician Assistant

## 2014-07-24 ENCOUNTER — Ambulatory Visit (INDEPENDENT_AMBULATORY_CARE_PROVIDER_SITE_OTHER): Payer: BC Managed Care – PPO | Admitting: Physician Assistant

## 2014-07-24 ENCOUNTER — Ambulatory Visit (INDEPENDENT_AMBULATORY_CARE_PROVIDER_SITE_OTHER): Payer: BC Managed Care – PPO

## 2014-07-24 VITALS — BP 112/84 | HR 46 | Temp 98.7°F | Wt 144.0 lb

## 2014-07-24 DIAGNOSIS — R05 Cough: Secondary | ICD-10-CM

## 2014-07-24 DIAGNOSIS — J189 Pneumonia, unspecified organism: Secondary | ICD-10-CM | POA: Diagnosis not present

## 2014-07-24 DIAGNOSIS — R059 Cough, unspecified: Secondary | ICD-10-CM

## 2014-07-24 DIAGNOSIS — J181 Lobar pneumonia, unspecified organism: Principal | ICD-10-CM

## 2014-07-24 MED ORDER — CEFTRIAXONE SODIUM 1 G IJ SOLR: 1.0000 g | Freq: Once | INTRAMUSCULAR | Status: DC

## 2014-07-24 NOTE — Progress Notes (Signed)
   Subjective:    Patient ID: Jacob Kline, male    DOB: Jul 20, 1985, 29 y.o.   MRN: [id number]  HPI  Pt is a 29 yo male with myasthenia gravis and recent hx of left pneumonia and treated with doxycycline. He came back for follow up and and was feeling much better. Sunday pt started back with cough and running nose and sneezing. He mowed the lawn on Saturday and thought was allergies. Monday he ran a fever of 100.6. No fever yesterday or today. He does have a productive cough. He doesn't feel too out of breath. He does have a mild ST. He is taking zyrtec but nothing else. No nausea or vomiting.      Review of Systems  All other systems reviewed and are negative.      Objective:   Physical Exam  Constitutional: He is oriented to person, place, and time. He appears well-developed and well-nourished.  HENT:  Head: Normocephalic and atraumatic.  Right Ear: External ear normal.  Left Ear: External ear normal.  Slightly erythematous oropharynx.  Bilateral nasal turbinates red and swollen.  No tenderness over sinuses.   Eyes: Conjunctivae are normal.  Neck: Normal range of motion. Neck supple.  Cardiovascular: Normal rate, regular rhythm and normal heart sounds.   Pulmonary/Chest: Effort normal.  No wheezing.  Some mild crackles over left and right lower lungs.  Wet to dry cough on exam.   Lymphadenopathy:    He has no cervical adenopathy.  Neurological: He is alert and oriented to person, place, and time.  Skin: Skin is dry.  Psychiatric: He has a normal mood and affect. His behavior is normal.          Assessment & Plan:  Right lower lobe pneumonia/cough- CXR confirmed right pneumonia. He was last treated about 1 and 1/2 weeks ago with doxycycline.  Discussed with Dr. Nicholes RoughAllision Hommel, neurologist, rocephin is not on allergy list and should be fine not to interfere with myastenia gravis. Pt's wife is nurse and sent rocephin 1g to pharmacy for daily injections for 7 days. If  not improving please follow up or in 1 week with [facility].

## 2014-10-09 ENCOUNTER — Encounter: Payer: Self-pay | Admitting: Family Medicine

## 2014-10-09 ENCOUNTER — Ambulatory Visit (INDEPENDENT_AMBULATORY_CARE_PROVIDER_SITE_OTHER): Payer: BC Managed Care – PPO | Admitting: Family Medicine

## 2014-10-09 VITALS — BP 126/81 | HR 66 | Temp 98.4°F | Wt 149.0 lb

## 2014-10-09 DIAGNOSIS — J0100 Acute maxillary sinusitis, unspecified: Secondary | ICD-10-CM

## 2014-10-09 DIAGNOSIS — J329 Chronic sinusitis, unspecified: Secondary | ICD-10-CM | POA: Insufficient documentation

## 2014-10-09 MED ORDER — IPRATROPIUM BROMIDE 0.06 % NA SOLN: 2.0000 | Freq: Four times a day (QID) | NASAL | Status: DC

## 2014-10-09 NOTE — Patient Instructions (Signed)
Thank you for coming in today. Uses Zyrtec plus cold medicines. Use Flonase and Rhinocort or Nasonex. Use also in conjunction Atrovent nasal spray. If not getting better start taking Prilosec over-the-counter. If not better give me a call and we'll send in some prednisone. Call or go to the emergency room if you get worse, have trouble breathing, have chest pains, or palpitations.   Sinusitis Sinusitis is redness, soreness, and inflammation of the paranasal sinuses. Paranasal sinuses are air pockets within the bones of your face (beneath the eyes, the middle of the forehead, or above the eyes). In healthy paranasal sinuses, mucus is able to drain out, and air is able to circulate through them by way of your nose. However, when your paranasal sinuses are inflamed, mucus and air can become trapped. This can allow bacteria and other germs to grow and cause infection. Sinusitis can develop quickly and last only a short time (acute) or continue over a long period (chronic). Sinusitis that lasts for more than 12 weeks is considered chronic.  CAUSES  Causes of sinusitis include:  Allergies.  Structural abnormalities, such as displacement of the cartilage that separates your nostrils (deviated septum), which can decrease the air flow through your nose and sinuses and affect sinus drainage.  Functional abnormalities, such as when the small hairs (cilia) that line your sinuses and help remove mucus do not work properly or are not present. SIGNS AND SYMPTOMS  Symptoms of acute and chronic sinusitis are the same. The primary symptoms are pain and pressure around the affected sinuses. Other symptoms include:  Upper toothache.  Earache.  Headache.  Bad breath.  Decreased sense of smell and taste.  A cough, which worsens when you are lying flat.  Fatigue.  Fever.  Thick drainage from your nose, which often is green and may contain pus (purulent).  Swelling and warmth over the affected  sinuses. DIAGNOSIS  Your health care provider will perform a physical exam. During the exam, your health care provider may:  Look in your nose for signs of abnormal growths in your nostrils (nasal polyps).  Tap over the affected sinus to check for signs of infection.  View the inside of your sinuses (endoscopy) using an imaging device that has a light attached (endoscope). If your health care provider suspects that you have chronic sinusitis, one or more of the following tests may be recommended:  Allergy tests.  Nasal culture. A sample of mucus is taken from your nose, sent to a lab, and screened for bacteria.  Nasal cytology. A sample of mucus is taken from your nose and examined by your health care provider to determine if your sinusitis is related to an allergy. TREATMENT  Most cases of acute sinusitis are related to a viral infection and will resolve on their own within 10 days. Sometimes medicines are prescribed to help relieve symptoms (pain medicine, decongestants, nasal steroid sprays, or saline sprays).  However, for sinusitis related to a bacterial infection, your health care provider will prescribe antibiotic medicines. These are medicines that will help kill the bacteria causing the infection.  Rarely, sinusitis is caused by a fungal infection. In theses cases, your health care provider will prescribe antifungal medicine. For some cases of chronic sinusitis, surgery is needed. Generally, these are cases in which sinusitis recurs more than 3 times per year, despite other treatments. HOME CARE INSTRUCTIONS   Drink plenty of water. Water helps thin the mucus so your sinuses can drain more easily.  Use a humidifier.  Inhale steam 3 to 4 times a day (for example, sit in the bathroom with the shower running).  Apply a warm, moist washcloth to your face 3 to 4 times a day, or as directed by your health care provider.  Use saline nasal sprays to help moisten and clean your  sinuses.  Take medicines only as directed by your health care provider.  If you were prescribed either an antibiotic or antifungal medicine, finish it all even if you start to feel better. SEEK IMMEDIATE MEDICAL CARE IF:  You have increasing pain or severe headaches.  You have nausea, vomiting, or drowsiness.  You have swelling around your face.  You have vision problems.  You have a stiff neck.  You have difficulty breathing. MAKE SURE YOU:   Understand these instructions.  Will watch your condition.  Will get help right away if you are not doing well or get worse. Document Released: 03/08/2005 Document Revised: 07/23/2013 Document Reviewed: 03/23/2011 [facility] Patient Information 2015 [place], [place]. This information is not intended to replace advice given to you by your health care provider. Make sure you discuss any questions you have with your health care provider.

## 2014-10-09 NOTE — Assessment & Plan Note (Addendum)
Sinusitis with postnasal drip likely cause of cough. This is likely due to allergies. Trial of nasal steroids and and anti-histamines and Atrovent nasal spray. If not better trial of omeprazole for possible reflux cough. We'll reserve use of prednisone if not better.

## 2014-10-09 NOTE — Progress Notes (Signed)
Jacob HumbleChristopher D Kline is a 29 y.o. male who [facility] for sinus pressure congestion discharge and mild cough. Symptoms present for 3 weeks. Cough is nonproductive and mildly persistent. No wheezing or shortness of breath. Some over-the-counter sinus medications which have helped significantly. No vomiting or diarrhea. He does note some itchy watery eyes. He notes that he stopped taking his nasal steroid a few months ago. He takes Zyrtec daily.   Past Medical History  Diagnosis Date  . Myasthenia gravis     without exacerbation 358.00  . GERD (gastroesophageal reflux disease)    Past Surgical History  Procedure Laterality Date  . Tonsillectomy    . Tibia and fibia fracture  2003    RT  . Thymectomy  05-2007   History  Substance Use Topics  . Smoking status: Former Games developermoker  . Smokeless tobacco: Not on file  . Alcohol Use: No   ROS as above Medications: Current Outpatient Prescriptions  Medication Sig Dispense Refill  . cetirizine (ZYRTEC) 10 MG tablet Take 10 mg by mouth daily.    . JUBLIA 10 % SOLN   11  . mycophenolate (CELLCEPT) 250 MG capsule Take 250 mg by mouth daily.    Marland Kitchen. terbinafine (LAMISIL) 250 MG tablet     . ipratropium (ATROVENT) 0.06 % nasal spray Place 2 sprays into both nostrils 4 (four) times daily. 15 mL 1   No current facility-administered medications for this visit.   Allergies  Allergen Reactions  . Aminoglycosides     Due to Myasthenia Gravis  . Avelox [Moxifloxacin Hcl In Nacl]     Due to Myasthenia Gravis   . Beta Adrenergic Blockers     Due to Myasthenia Gravis  . Botulinum Toxins     Due to Myasthenia Gravis   . Calcium Channel Blockers     Due to Myasthenia Gravis   . Ciprofloxacin     Due to Myasthenia Gravis   . Factive [Gemifloxacin Mesylate]     Due to Myasthenia Gravis   . Floxin [Ocuflox]     Due to Myasthenia Gravis   . Interferons     Due to Myasthenia Gravis   .  Levofloxacin     Due to Myasthenia Gravis   . Macrolides And Ketolides     Due to Myasthenia Gravis   . Magnesium-Containing Compounds     Milk of mag,some antacids (like maalox or mylanta)  Due to Myasthenia Gravis   . Noroxin [Norfloxacin]     Due to Myasthenia Gravis   . Penicillamine   . Proquin Xr [Ciprofloxacin Hcl]     Due to Myasthenia Gravis   . Quinine Derivatives     Due to Myasthenia Gravis      Exam:  BP 126/81 mmHg  Pulse 66  Temp(Src) 98.4 F (36.9 C) (Oral)  Wt 149 lb (67.586 kg) Gen: Well NAD HEENT: EOMI,  MMM clear nasal discharge. Mildly inflamed nasal turbinates. Posterior pharynx with cobblestoning. Normal tympanic membranes bilaterally. No cervical lymphadenopathy present. Lungs: Normal work of breathing. CTABL Heart: RRR no MRG Abd: NABS, Soft. Nondistended, Nontender Exts: Brisk capillary refill, warm and well perfused.   No results found for this or any previous visit (from the past 24 hour(s)). No results found.   Please see individual assessment and plan sections.

## 2014-10-16 ENCOUNTER — Telehealth: Payer: Self-pay | Admitting: *Deleted

## 2014-10-16 MED ORDER — PREDNISONE 10 MG PO TABS: 30.0000 mg | ORAL_TABLET | Freq: Every day | ORAL | Status: DC

## 2014-10-16 NOTE — Telephone Encounter (Signed)
Will try prednisone.  Take omeprazole as well.  Rx sent into pharmacy for prednisone.  CMA will contact pt.

## 2014-10-16 NOTE — Telephone Encounter (Signed)
Pt advised of new Rx, will start it today. Advised Pt to contact us if his symptoms are not getting any better or if he has any further questions.

## 2014-10-16 NOTE — Telephone Encounter (Signed)
Pt called this morning and said that when he saw you last week you gave him a couple suggestions for his sinus infections-nasal spray & allergy med with decongestant.  He said that he's been using these but actually has gotten MORE sinus pressure and headache.  Please advise on recommendations for the next step.

## 2015-05-05 ENCOUNTER — Ambulatory Visit (INDEPENDENT_AMBULATORY_CARE_PROVIDER_SITE_OTHER): Payer: BC Managed Care – PPO | Admitting: Family Medicine

## 2015-05-05 ENCOUNTER — Encounter: Payer: Self-pay | Admitting: Family Medicine

## 2015-05-05 VITALS — BP 127/90 | HR 79 | Temp 98.2°F | Ht 65.0 in | Wt 157.0 lb

## 2015-05-05 DIAGNOSIS — J029 Acute pharyngitis, unspecified: Secondary | ICD-10-CM | POA: Diagnosis not present

## 2015-05-05 DIAGNOSIS — R52 Pain, unspecified: Secondary | ICD-10-CM | POA: Diagnosis not present

## 2015-05-05 DIAGNOSIS — J069 Acute upper respiratory infection, unspecified: Secondary | ICD-10-CM | POA: Diagnosis not present

## 2015-05-05 DIAGNOSIS — R05 Cough: Secondary | ICD-10-CM

## 2015-05-05 DIAGNOSIS — R059 Cough, unspecified: Secondary | ICD-10-CM

## 2015-05-05 LAB — POCT INFLUENZA A/B
Influenza A, POC: NEGATIVE
Influenza B, POC: NEGATIVE

## 2015-05-05 LAB — POCT RAPID STREP A (OFFICE): RAPID STREP A SCREEN: NEGATIVE

## 2015-05-05 MED ORDER — IPRATROPIUM BROMIDE 0.06 % NA SOLN: 2.0000 | Freq: Four times a day (QID) | NASAL | Status: DC

## 2015-05-05 MED ORDER — CEFDINIR 300 MG PO CAPS: 300.0000 mg | ORAL_CAPSULE | Freq: Two times a day (BID) | ORAL | Status: DC

## 2015-05-05 NOTE — Assessment & Plan Note (Signed)
Symptoms likely due to viral URI. Plan for symptomatic management with Atrovent nasal spray and over-the-counter medicines. Use backup Omnicef if not improved given relative immunocompromise status.

## 2015-05-05 NOTE — Patient Instructions (Signed)
Thank you for coming in today. Continue OTC medicine.  Use omnicef antibiotic if not better,.  Call or go to the emergency room if you get worse, have trouble breathing, have chest pains, or palpitations.   Use atrovent nasal spray.

## 2015-05-05 NOTE — Progress Notes (Signed)
Jacob Kline is a 30 y.o. male who presents to [facility]: Primary Care today for sore throat. Patient has a three-day history of cough congestion and body aches fatigue diarrhea. The sore throat started yesterday and became quite severe. He has tried over-the-counter medicines which do help. He has multiple sick contacts with his children. He feels well otherwise. No new medications.   Past Medical History  Diagnosis Date  . Myasthenia gravis     without exacerbation 358.00  . GERD (gastroesophageal reflux disease)    Past Surgical History  Procedure Laterality Date  . Tonsillectomy    . Tibia and fibia fracture  2003    RT  . Thymectomy  05-2007   Social History  Substance Use Topics  . Smoking status: Former Games developer  . Smokeless tobacco: Not on file  . Alcohol Use: No   family history includes Cancer in his maternal grandmother; Diabetes in his maternal grandmother; Hyperlipidemia in his father and mother; Hypertension in his father and mother.  ROS as above Medications: Current Outpatient Prescriptions  Medication Sig Dispense Refill  . mycophenolate (CELLCEPT) 250 MG capsule Take 250 mg by mouth daily.    . cefdinir (OMNICEF) 300 MG capsule Take 1 capsule (300 mg total) by mouth 2 (two) times daily. 14 capsule 0  . ipratropium (ATROVENT) 0.06 % nasal spray Place 2 sprays into both nostrils 4 (four) times daily. 15 mL 1   No current facility-administered medications for this visit.   Allergies  Allergen Reactions  . Aminoglycosides     Due to Myasthenia Gravis  . Avelox [Moxifloxacin Hcl In Nacl]     Due to Myasthenia Gravis   . Beta Adrenergic Blockers     Due to Myasthenia Gravis  . Botulinum Toxins     Due to Myasthenia Gravis   . Calcium Channel Blockers     Due to Myasthenia Gravis   . Ciprofloxacin     Due to Myasthenia Gravis   . Factive [Gemifloxacin  Mesylate]     Due to Myasthenia Gravis   . Floxin [Ocuflox]     Due to Myasthenia Gravis   . Interferons     Due to Myasthenia Gravis   . Levofloxacin     Due to Myasthenia Gravis   . Macrolides And Ketolides     Due to Myasthenia Gravis   . Magnesium-Containing Compounds     Milk of mag,some antacids (like maalox or mylanta)  Due to Myasthenia Gravis   . Noroxin [Norfloxacin]     Due to Myasthenia Gravis   . Penicillamine   . Proquin Xr [Ciprofloxacin Hcl]     Due to Myasthenia Gravis   . Quinine Derivatives     Due to Myasthenia Gravis      Exam:  BP 127/90 mmHg  Pulse 79  Temp(Src) 98.2 F (36.8 C) (Oral)  Ht  (1.651 m)  Wt 157 lb (71.215 kg)  BMI 26.13 kg/m2  SpO2 99% Gen: Well NAD HEENT: EOMI,  MMM history pharynx is erythematous with cobblestoning. Normal tympanic membranes bilaterally. No significant cervical lymphadenopathy is present. Lungs: Normal work of breathing. CTABL Heart: RRR no MRG Abd: NABS, Soft. Nondistended, Nontender Exts: Brisk capillary refill, warm and well perfused.   Results for orders placed or performed in visit on 05/05/15 (from the past 24 hour(s))  POCT Influenza A/B     Status: Normal   Collection Time: 05/05/15  9:17 AM  Result  Value Ref Range   Influenza A, POC Negative Negative   Influenza B, POC Negative Negative  POCT rapid strep A     Status: Normal   Collection Time: 05/05/15  9:17 AM  Result Value Ref Range   Rapid Strep A Screen Negative Negative   No results found.   Please see individual assessment and plan sections.

## 2015-09-16 ENCOUNTER — Telehealth: Payer: BC Managed Care – PPO | Admitting: Family

## 2015-09-16 DIAGNOSIS — L209 Atopic dermatitis, unspecified: Secondary | ICD-10-CM

## 2015-09-16 MED ORDER — PREDNISONE 10 MG PO TABS: 10.0000 mg | ORAL_TABLET | Freq: Every day | ORAL | Status: DC

## 2015-09-16 NOTE — Progress Notes (Signed)
E Visit for Rash  We are sorry that you are not feeling well. Here is how we plan to help!  I have prescribed Prednisone 10 mg daily for 5 days    HOME CARE:   Take cool showers and avoid direct sunlight.  Apply cool compress or wet dressings.  Take a bath in an oatmeal bath.  Sprinkle content of one Aveeno packet under running faucet with comfortably warm water.  Bathe for 15-20 minutes, 1-2 times daily.  Pat dry with a towel. Do not rub the rash.  Use hydrocortisone cream.  Take an antihistamine like Benadryl for widespread rashes that itch.  The adult dose of Benadryl is 25-50 mg by mouth 4 times daily.  Caution:  This type of medication may cause sleepiness.  Do not drink alcohol, drive, or operate dangerous machinery while taking antihistamines.  Do not take these medications if you have prostate enlargement.  Read package instructions thoroughly on all medications that you take.  GET HELP RIGHT AWAY IF:   Symptoms don't go away after treatment.  Severe itching that persists.  If you rash spreads or swells.  If you rash begins to smell.  If it blisters and opens or develops a yellow-brown crust.  You develop a fever.  You have a sore throat.  You become short of breath.  MAKE SURE YOU:  Understand these instructions. Will watch your condition. Will get help right away if you are not doing well or get worse.  Thank you for choosing an e-visit. Your e-visit answers were reviewed by a board certified advanced clinical practitioner to complete your personal care plan. Depending upon the condition, your plan could have included both over the counter or prescription medications. Please review your pharmacy choice. Be sure that the pharmacy you have chosen is open so that you can pick up your prescription now.  If there is a problem you may message your provider in MyChart to have the prescription routed to another pharmacy. Your safety is important to us. If you have  drug allergies check your prescription carefully.  For the next 24 hours, you can use MyChart to ask questions about today's visit, request a non-urgent call back, or ask for a work or school excuse from your e-visit provider. You will get an email in the next two days asking about your experience. I hope that your e-visit has been valuable and will speed your recovery.     

## 2015-11-03 DIAGNOSIS — N200 Calculus of kidney: Secondary | ICD-10-CM | POA: Insufficient documentation

## 2015-11-03 DIAGNOSIS — R319 Hematuria, unspecified: Secondary | ICD-10-CM | POA: Insufficient documentation

## 2015-11-03 DIAGNOSIS — N201 Calculus of ureter: Secondary | ICD-10-CM | POA: Insufficient documentation

## 2016-01-13 IMAGING — CR DG CHEST 2V
2 series · 2 of 2 positions shown · non-contrast
Comparison: 09/07/2007

CLINICAL DATA: Cough, congestion, fever, flu-like symptoms

EXAM:
CHEST  2 VIEW

[chest pa]
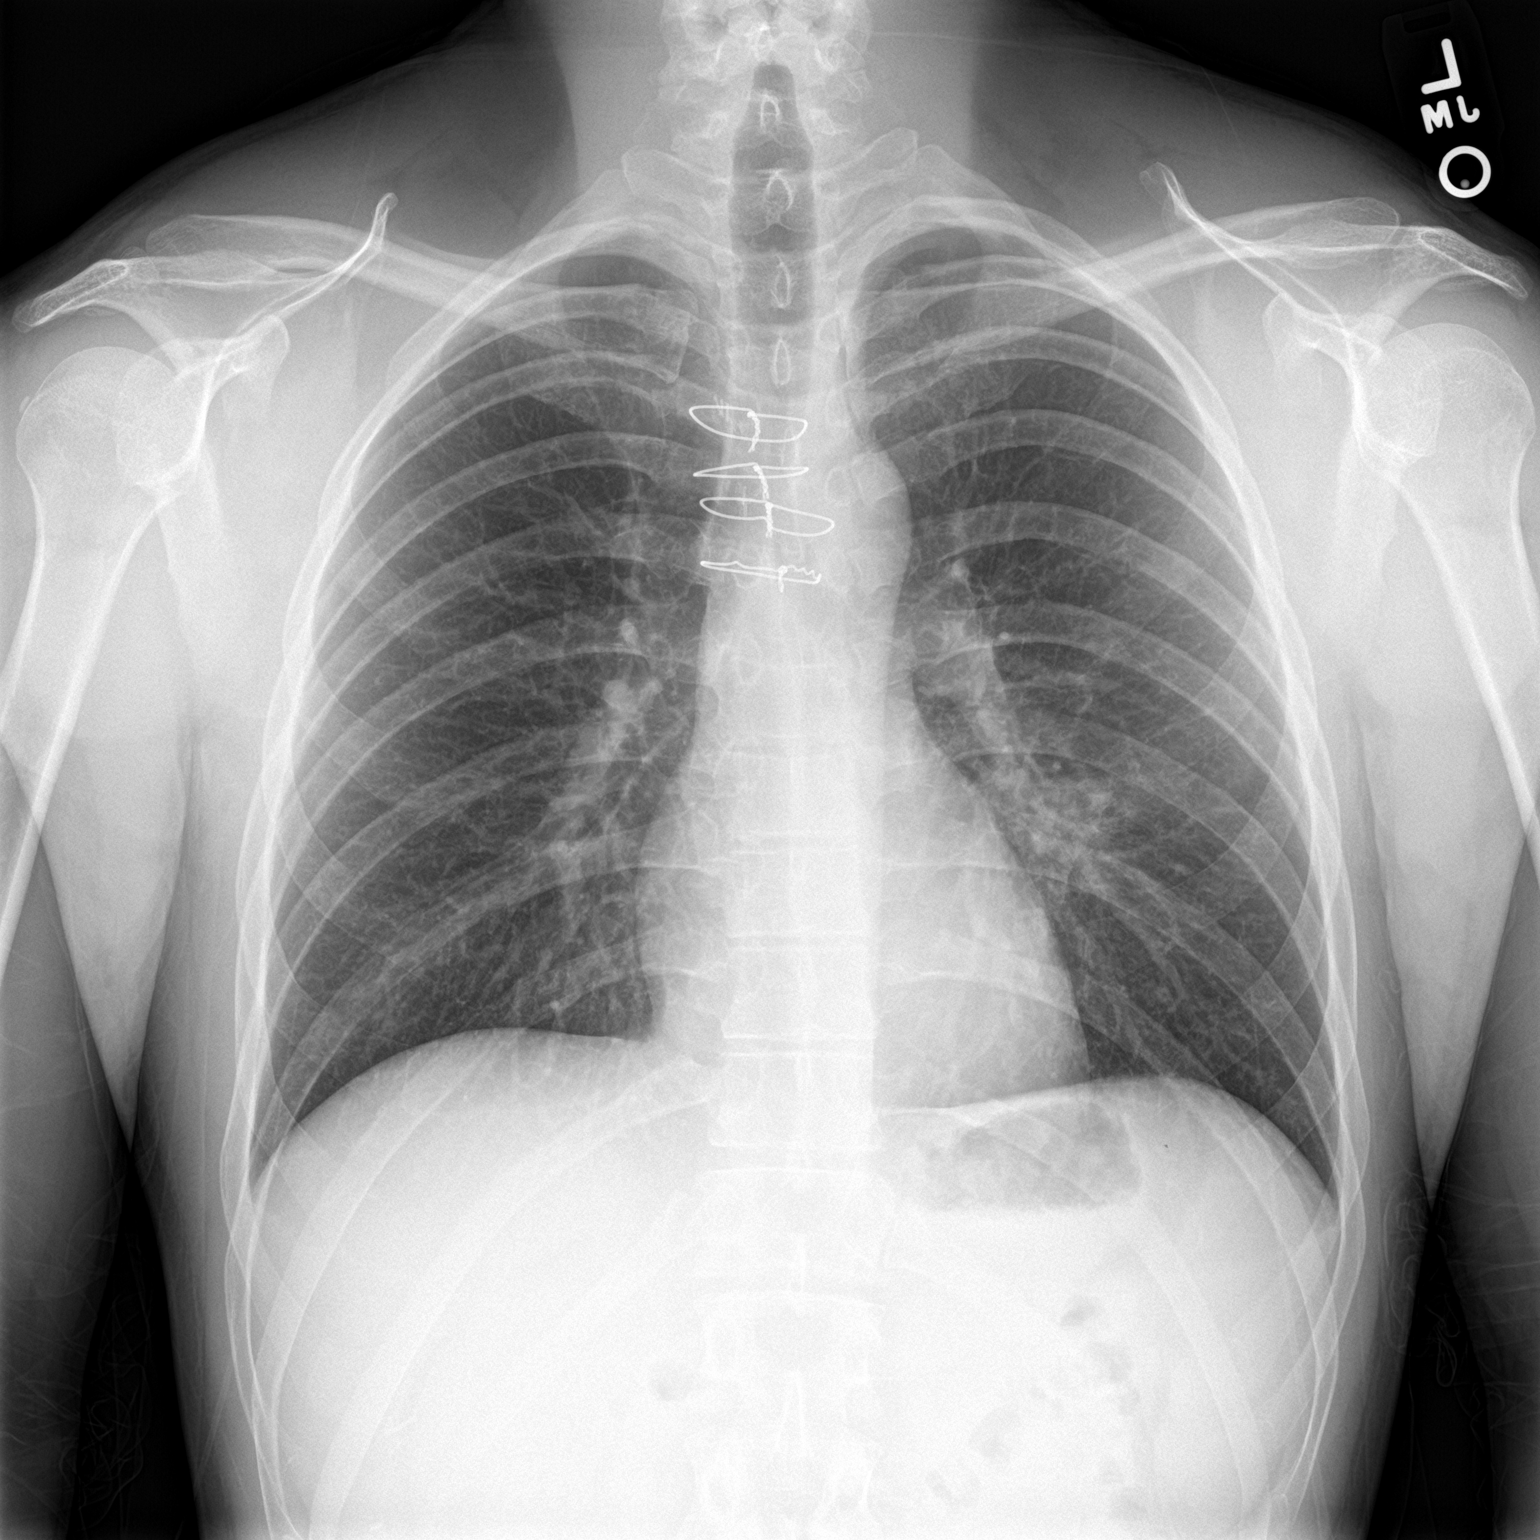

[chest lat]
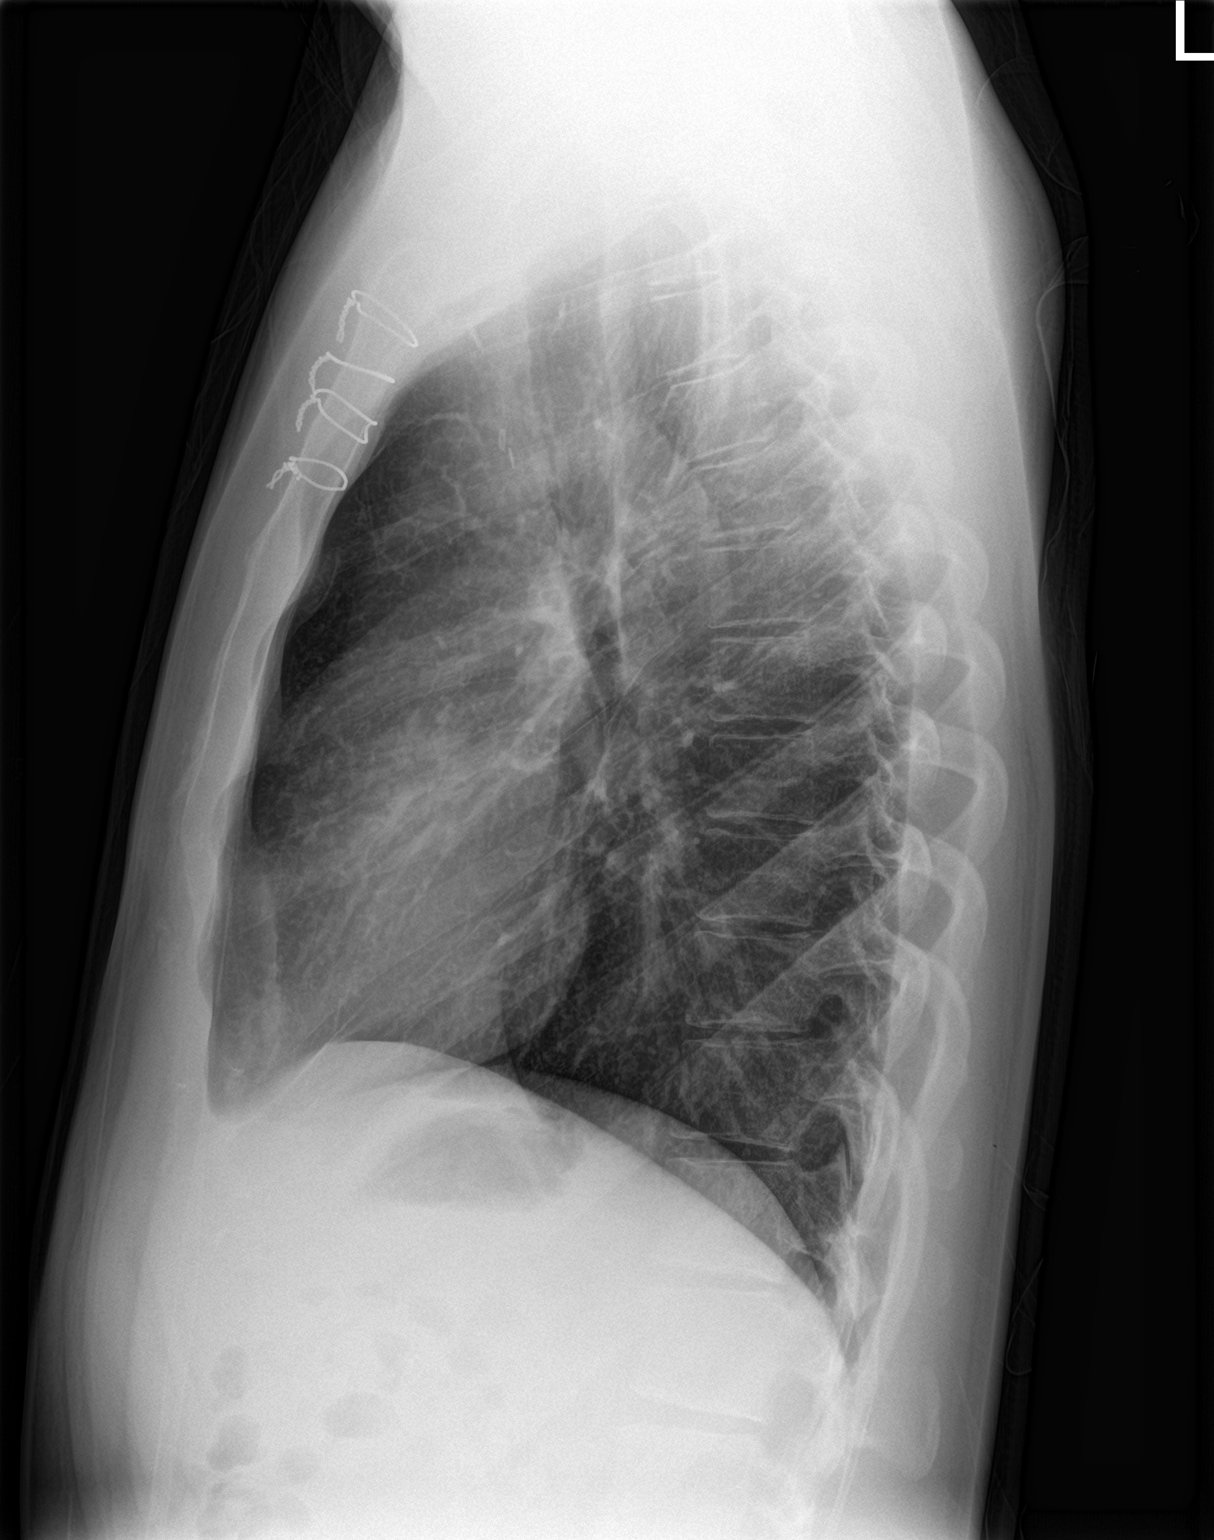

[2 of 2 positions shown; findings below may reference images not displayed]

FINDINGS: Cardiomediastinal silhouette is stable. Status post median
sternotomy. No acute infiltrate or pleural effusion. No pulmonary
edema. Bony thorax is unremarkable.
IMPRESSION: No active cardiopulmonary disease.

## 2016-02-11 IMAGING — CR DG CHEST 2V
2 series · 2 of 2 positions shown · non-contrast
Comparison: PA and lateral chest x-ray July 16, 2014

CLINICAL DATA: Cough fever and congestion with recent diagnosis of
pneumonia, history of myasthenia gravis and thymectomy

EXAM:
CHEST  2 VIEW

[chest pa]
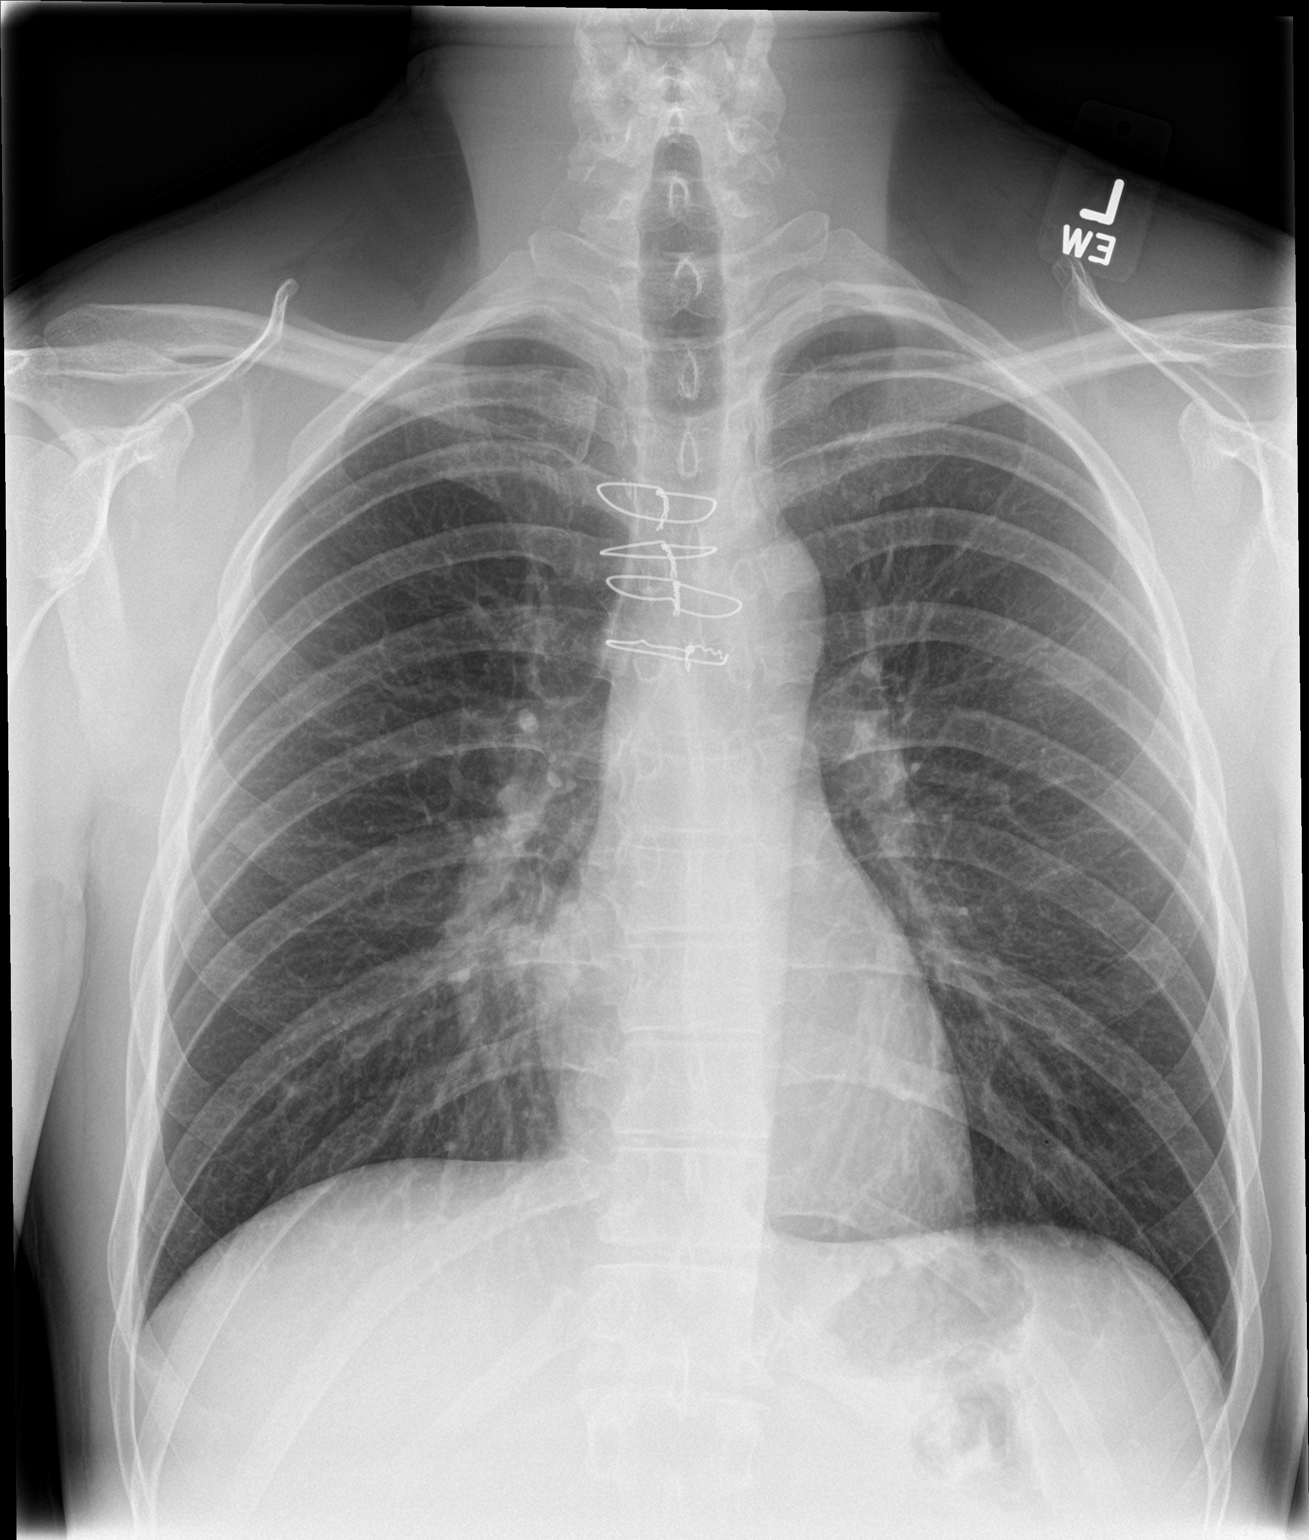

[chest lat]
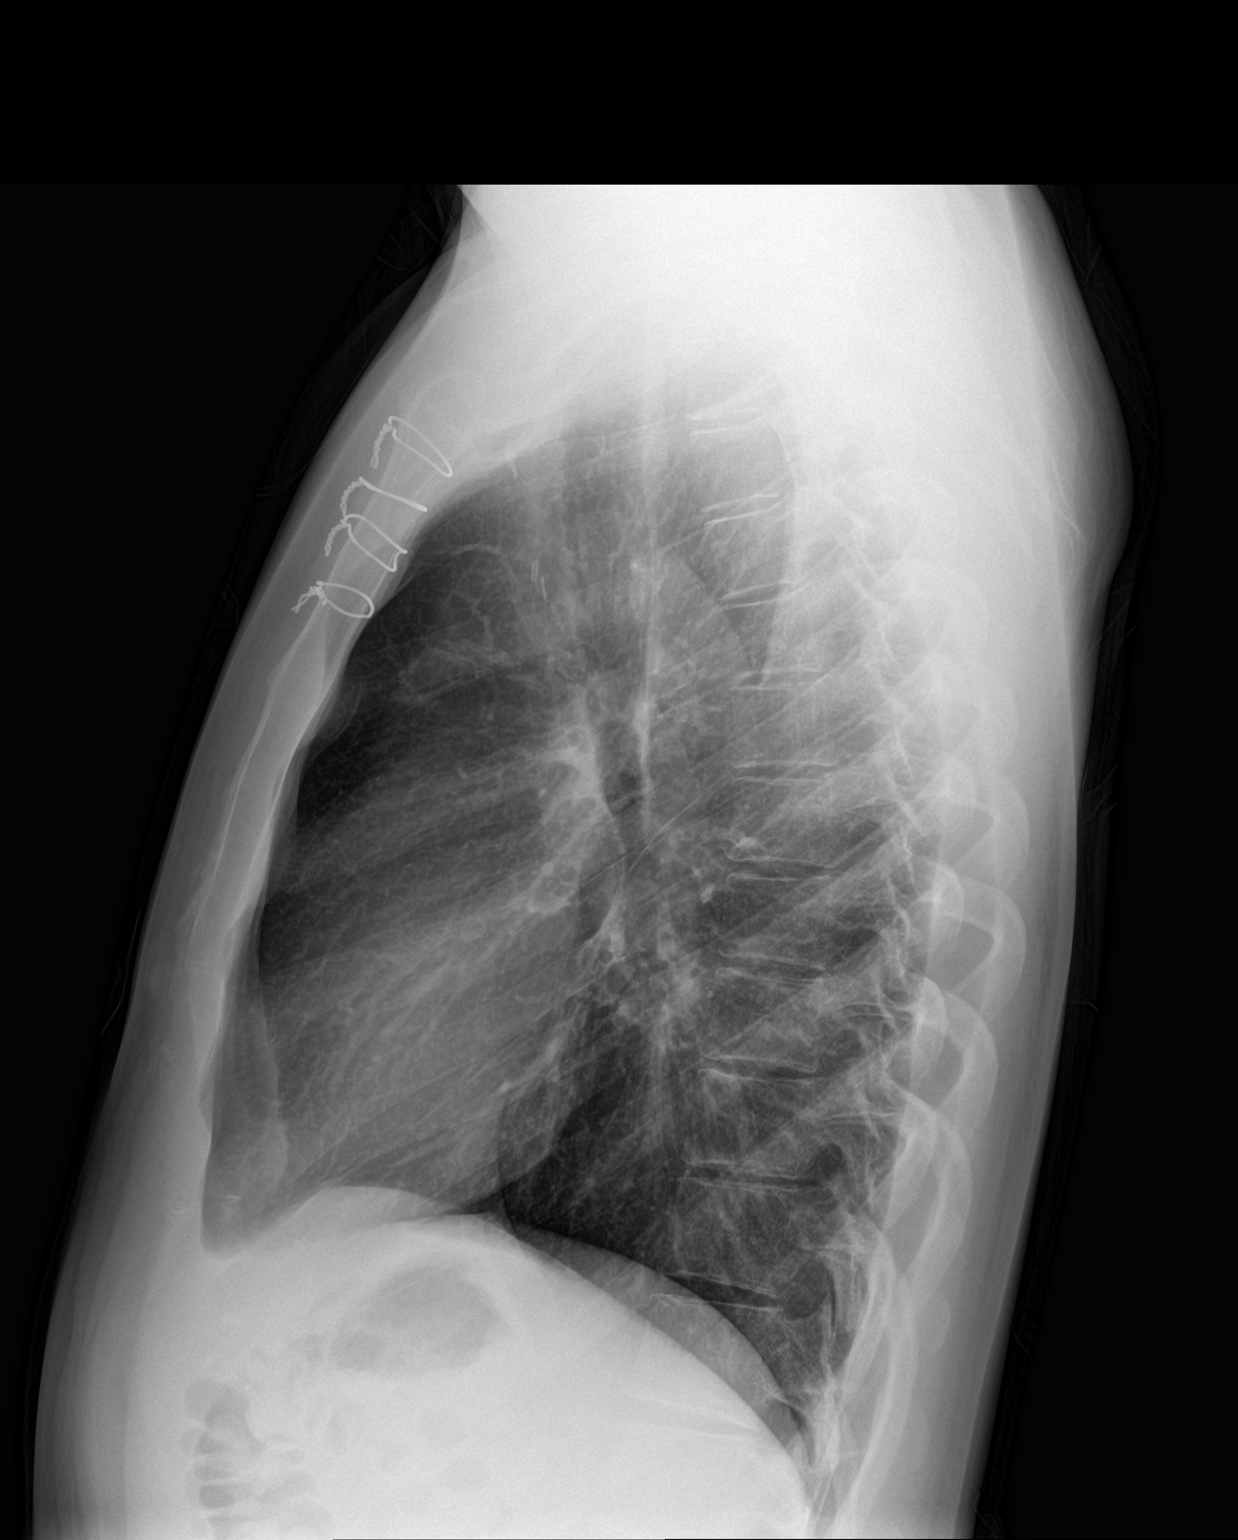

[2 of 2 positions shown; findings below may reference images not displayed]

FINDINGS: The lungs are well-expanded. There is a subtle area of infiltrate
medially in the right lower lobe. The left lung is clear. The heart
and pulmonary vascularity are normal. There are 4 intact sternal
wires. The mediastinum is normal in width. The trachea is midline.
The bony thorax is unremarkable.
IMPRESSION: Small area of pneumonia in the right lower lobe.

## 2016-03-07 DIAGNOSIS — Z7960 Long term (current) use of unspecified immunomodulators and immunosuppressants: Secondary | ICD-10-CM | POA: Insufficient documentation

## 2016-03-07 DIAGNOSIS — Z79899 Other long term (current) drug therapy: Secondary | ICD-10-CM | POA: Insufficient documentation

## 2018-05-08 ENCOUNTER — Telehealth: Payer: Self-pay | Admitting: Family Medicine

## 2018-05-08 ENCOUNTER — Ambulatory Visit (INDEPENDENT_AMBULATORY_CARE_PROVIDER_SITE_OTHER): Payer: BC Managed Care – PPO

## 2018-05-08 ENCOUNTER — Encounter: Payer: Self-pay | Admitting: Family Medicine

## 2018-05-08 ENCOUNTER — Ambulatory Visit (INDEPENDENT_AMBULATORY_CARE_PROVIDER_SITE_OTHER): Payer: BC Managed Care – PPO | Admitting: Family Medicine

## 2018-05-08 VITALS — BP 146/87 | HR 100 | Temp 99.0°F | Ht 65.0 in | Wt 162.0 lb

## 2018-05-08 DIAGNOSIS — R059 Cough, unspecified: Secondary | ICD-10-CM

## 2018-05-08 DIAGNOSIS — R05 Cough: Secondary | ICD-10-CM

## 2018-05-08 MED ORDER — GUAIFENESIN-CODEINE 100-10 MG/5ML PO SOLN: 5.0000 mL | Freq: Four times a day (QID) | ORAL | 0 refills | Status: DC | PRN

## 2018-05-08 MED ORDER — OSELTAMIVIR PHOSPHATE 75 MG PO CAPS: 75.0000 mg | ORAL_CAPSULE | Freq: Two times a day (BID) | ORAL | 0 refills | Status: DC

## 2018-05-08 NOTE — Patient Instructions (Addendum)
Thank you for coming in today.  Continue theraflu. Consider maximizing the tylenol compoent.  Max dose of tylenol is 1000mg  every 6 hours.  You can also take it ibuprofen or aleve.  Use the codeine cough medicine as needed.  Ok to continue over the counter.   Call or go to the emergency room if you get worse, have trouble breathing, have chest pains, or palpitations.   Recheck with Dr Linford Arnold soon about physical and blood pressure.   Let me know if your daughter tests positive for flu.    Acute Bronchitis, Adult  Acute bronchitis is sudden (acute) swelling of the air tubes (bronchi) in the lungs. Acute bronchitis causes these tubes to fill with mucus, which can make it hard to breathe. It can also cause coughing or wheezing. In adults, acute bronchitis usually goes away within 2 weeks. A cough caused by bronchitis may last up to 3 weeks. Smoking, allergies, and asthma can make the condition worse. Repeated episodes of bronchitis may cause further lung problems, such as chronic obstructive pulmonary disease (COPD). What are the causes? This condition can be caused by germs and by substances that irritate the lungs, including:  Cold and flu viruses. This condition is most often caused by the same virus that causes a cold.  Bacteria.  Exposure to tobacco smoke, dust, fumes, and air pollution. What increases the risk? This condition is more likely to develop in people who:  Have close contact with someone with acute bronchitis.  Are exposed to lung irritants, such as tobacco smoke, dust, fumes, and vapors.  Have a weak immune system.  Have a respiratory condition such as asthma. What are the signs or symptoms? Symptoms of this condition include:  A cough.  Coughing up clear, yellow, or green mucus.  Wheezing.  Chest congestion.  Shortness of breath.  A fever.  Body aches.  Chills.  A sore throat. How is this diagnosed? This condition is usually diagnosed with a  physical exam. During the exam, your health care provider may order tests, such as chest X-rays, to rule out other conditions. He or she may also:  Test a sample of your mucus for bacterial infection.  Check the level of oxygen in your blood. This is done to check for pneumonia.  Do a chest X-ray or lung function testing to rule out pneumonia and other conditions.  Perform blood tests. Your health care provider will also ask about your symptoms and medical history. How is this treated? Most cases of acute bronchitis clear up over time without treatment. Your health care provider may recommend:  Drinking more fluids. Drinking more makes your mucus thinner, which may make it easier to breathe.  Taking a medicine for a fever or cough.  Taking an antibiotic medicine.  Using an inhaler to help improve shortness of breath and to control a cough.  Using a cool mist vaporizer or humidifier to make it easier to breathe. Follow these instructions at home: Medicines  Take over-the-counter and prescription medicines only as told by your health care provider.  If you were prescribed an antibiotic, take it as told by your health care provider. Do not stop taking the antibiotic even if you start to feel better. General instructions   Get plenty of rest.  Drink enough fluids to keep your urine pale yellow.  Avoid smoking and secondhand smoke. Exposure to cigarette smoke or irritating chemicals will make bronchitis worse. If you smoke and you need help quitting, ask your health  care provider. Quitting smoking will help your lungs heal faster.  Use an inhaler, cool mist vaporizer, or humidifier as told by your health care provider.  Keep all follow-up visits as told by your health care provider. This is important. How is this prevented? To lower your risk of getting this condition again:  Wash your hands often with soap and water. If soap and water are not available, use hand  sanitizer.  Avoid contact with people who have cold symptoms.  Try not to touch your hands to your mouth, nose, or eyes.  Make sure to get the flu shot every year. Contact a health care provider if:  Your symptoms do not improve in 2 weeks of treatment. Get help right away if:  You cough up blood.  You have chest pain.  You have severe shortness of breath.  You become dehydrated.  You faint or keep feeling like you are going to faint.  You keep vomiting.  You have a severe headache.  Your fever or chills gets worse. This information is not intended to replace advice given to you by your health care provider. Make sure you discuss any questions you have with your health care provider. Document Released: 04/15/2004 Document Revised: 10/20/2016 Document Reviewed: 08/27/2015 Elsevier Interactive Patient Education  2019 [employer].

## 2018-05-08 NOTE — Progress Notes (Signed)
vv

## 2018-05-08 NOTE — Addendum Note (Signed)
Addended by: Rodolph Bong on: 05/08/2018 12:02 PM   Modules accepted: Orders

## 2018-05-08 NOTE — Progress Notes (Addendum)
Jacob Kline is a 33 y.o. male who presents to [facility] Sports Medicine today for cough congestion fevers and chills.  Symptoms started yesterday and worsened today.  Patient is also developed body aches.  His daughter is sick with a similar illness.  Patient has a medical history significant for myasthenia gravis currently controlled with CellCept.  His current symptoms were not typical for usual viral URI.  He is tried some over-the-counter medications for cough which have helped a little.   ROS as above:  Exam:  BP (!) 146/87   Pulse 100   Temp 99 F (37.2 C) (Oral)   Ht 5\' 5"  (1.651 m)   Wt 162 lb (73.5 kg)   SpO2 95%   BMI 26.96 kg/m  Wt Readings from Last 5 Encounters:  05/08/18 162 lb (73.5 kg)  05/05/15 157 lb (71.2 kg)  10/09/14 149 lb (67.6 kg)  07/24/14 144 lb (65.3 kg)  07/16/14 145 lb (65.8 kg)    Gen: Well NAD HEENT: EOMI,  MMM inflamed nasal turbinates bilaterally with clear discharge.  Nontender maxillary sinuses.  Mild cervical lymphadenopathy. Lungs: Normal work of breathing.  Coarse breath sounds present lower lung fields bilaterally. Heart: RRR no MRG Abd: NABS, Soft. Nondistended, Nontender Exts: Brisk capillary refill, warm and well perfused.   Lab and Radiology Results  Two-view chest x-ray images personally independently reviewed No acute infiltrates.  Well appearance sternum wires present. Await formal radiology review  Influenza test negative for flu a and B   Assessment and Plan: 33 y.o. male with cough and congestion likely viral etiology.  Influenza test is negative.  Chest x-ray per my read not remarkable for community-acquired pneumonia.  Plan for symptomatic management with over-the-counter medications.  Additionally use codeine cough syrup.  Recommend follow-up with PCP in the near future.   Addendum: Patient notes that  his daughter who is sick with a similar illness did test positive for the flu and her visit with her pediatrician this morning.  Based on that and his symptoms I do think it is reasonable to go ahead and prescribe Tamiflu.  PDMP reviewed during this encounter. Orders Placed This Encounter  Procedures  . DG Chest 2 View    Order Specific Question:   Reason for exam:    Answer:   Cough, assess intra-thoracic pathology    Order Specific Question:   Preferred imaging location?    Answer:   [facility]   Meds ordered this encounter  Medications  . guaiFENesin-codeine 100-10 MG/5ML syrup    Sig: Take 5 mLs by mouth every 6 (six) hours as needed for cough. Take mostly at bedtime    Dispense:  120 mL    Refill:  0  . oseltamivir (TAMIFLU) 75 MG capsule    Sig: Take 1 capsule (75 mg total) by mouth 2 (two) times daily.    Dispense:  10 capsule    Refill:  0     Historical information moved to improve visibility of documentation.  Past Medical History:  Diagnosis Date  . GERD (gastroesophageal reflux disease)   . Myasthenia gravis    without exacerbation 358.00   Past Surgical History:  Procedure Laterality Date  . THYMECTOMY  05-2007  . tibia and fibia fracture  2003   RT  . TONSILLECTOMY     Social History   Tobacco Use  . Smoking status: Former Games developer  . Smokeless tobacco: Never Used  Substance Use Topics  . Alcohol use: No   family history includes Cancer in his maternal grandmother; Diabetes in his maternal grandmother; Hyperlipidemia in his father and mother; Hypertension in his father and mother.  Medications: Current Outpatient Medications  Medication Sig Dispense Refill  . ipratropium (ATROVENT) 0.06 % nasal spray Place 2 sprays into both nostrils 4 (four) times daily. 15 mL 1  . mycophenolate (CELLCEPT) 250 MG capsule Take 250 mg by mouth daily.    Marland Kitchen guaiFENesin-codeine 100-10 MG/5ML syrup Take 5 mLs by mouth every 6 (six) hours as needed for cough.  Take mostly at bedtime 120 mL 0  . oseltamivir (TAMIFLU) 75 MG capsule Take 1 capsule (75 mg total) by mouth 2 (two) times daily. 10 capsule 0   No current facility-administered medications for this visit.    Allergies  Allergen Reactions  . Aminoglycosides     Due to Myasthenia Gravis  . Avelox [Moxifloxacin Hcl In Nacl]     Due to Myasthenia Gravis   . Beta Adrenergic Blockers     Due to Myasthenia Gravis  . Botulinum Toxins     Due to Myasthenia Gravis   . Calcium Channel Blockers     Due to Myasthenia Gravis   . Ciprofloxacin     Due to Myasthenia Gravis   . Factive [Gemifloxacin Mesylate]     Due to Myasthenia Gravis   . Floxin [Ocuflox]     Due to Myasthenia Gravis   . Interferons     Due to Myasthenia Gravis   . Levofloxacin     Due to Myasthenia Gravis   . Macrolides And Ketolides     Due to Myasthenia Gravis   . Magnesium-Containing Compounds     Milk of mag,some antacids (like maalox or mylanta)  Due to Myasthenia Gravis   . Noroxin [Norfloxacin]     Due to Myasthenia Gravis   . Penicillamine   . Proquin Xr [Ciprofloxacin Hcl]     Due to Myasthenia Gravis   . Quinine Derivatives     Due to Myasthenia Gravis      Discussed warning signs or symptoms. Please see discharge instructions. Patient expresses understanding.

## 2018-05-08 NOTE — Telephone Encounter (Signed)
Patient called and stated that his daughter was seen and tested positive for Flu A and wanted to know if you could send in Tamiflu to the pharmacy. Patient is aware. FYI.

## 2018-05-08 NOTE — Telephone Encounter (Signed)
Case it makes sense to go ahead and treat with Tamiflu.  Tamiflu sent to pharmacy.  Patient is aware.

## 2018-05-16 ENCOUNTER — Encounter: Payer: Self-pay | Admitting: Family Medicine

## 2018-05-16 ENCOUNTER — Ambulatory Visit (INDEPENDENT_AMBULATORY_CARE_PROVIDER_SITE_OTHER): Payer: BC Managed Care – PPO | Admitting: Family Medicine

## 2018-05-16 ENCOUNTER — Encounter: Payer: Self-pay | Admitting: *Deleted

## 2018-05-16 VITALS — BP 137/73 | HR 62 | Ht 65.0 in | Wt 158.0 lb

## 2018-05-16 DIAGNOSIS — Z0000 Encounter for general adult medical examination without abnormal findings: Secondary | ICD-10-CM

## 2018-05-16 DIAGNOSIS — Z111 Encounter for screening for respiratory tuberculosis: Secondary | ICD-10-CM | POA: Diagnosis not present

## 2018-05-16 DIAGNOSIS — G7000 Myasthenia gravis without (acute) exacerbation: Secondary | ICD-10-CM

## 2018-05-16 DIAGNOSIS — Z23 Encounter for immunization: Secondary | ICD-10-CM | POA: Diagnosis not present

## 2018-05-16 DIAGNOSIS — Z0184 Encounter for antibody response examination: Secondary | ICD-10-CM

## 2018-05-16 NOTE — Progress Notes (Signed)
PPD placed in R forearm. Pt tolerated well, told to RTC in 48-72 hours for reading.Marland KitchenMarland KitchenMarland KitchenHeath Gold, CMA

## 2018-05-16 NOTE — Progress Notes (Signed)
Established Patient Office Visit  Subjective:  Patient ID: Jacob Kline, male    DOB: 1985/04/11  Age: 33 y.o. MRN: [id number]  CC:  Chief Complaint  Patient presents with  . Follow-up    HPI Jacob Kline presents for CPE.  He recently got into the [facility] and will be leaving in July to go to [facility] for 10 weeks of training and needs a physical beforehand.  He is still followed at [facility] for his myasthenia gravis and is actually doing really well.  He is off of prednisone and now just on CellCept.  They have been slowly trying to wean his dose.  He does go yearly for eye exams.  Tetanus is up-to-date.  Flu vaccine is up-to-date.    Past Medical History:  Diagnosis Date  . GERD (gastroesophageal reflux disease)   . Myasthenia gravis    without exacerbation 358.00    Past Surgical History:  Procedure Laterality Date  . THYMECTOMY  05-2007  . tibia and fibia fracture  2003   RT  . TONSILLECTOMY      Family History  Problem Relation Age of Onset  . Hyperlipidemia Mother   . Hypertension Mother   . Hyperlipidemia Father   . Hypertension Father   . Cancer Maternal Grandmother   . Diabetes Maternal Grandmother     Social History   Socioeconomic History  . Marital status: Married    Spouse name: Not on file  . Number of children: Not on file  . Years of education: Not on file  . Highest education level: Not on file  Occupational History  . Not on file  Social Needs  . Financial resource strain: Not on file  . Food insecurity:    Worry: Not on file    Inability: Not on file  . Transportation needs:    Medical: Not on file    Non-medical: Not on file  Tobacco Use  . Smoking status: Former Games developer  . Smokeless tobacco: Never Used  Substance and Sexual Activity  . Alcohol use: No  . Drug use: No  . 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   Lifestyle  .  Physical activity:    Days per week: Not on file    Minutes per session: Not on file  . Stress: Not on file  Relationships  . Social connections:    Talks on phone: Not on file    Gets together: Not on file    Attends religious service: Not on file    Active member of club or organization: Not on file    Attends meetings of clubs or organizations: Not on file    Relationship status: Not on file  . Intimate partner violence:    Fear of current or ex partner: Not on file    Emotionally abused: Not on file    Physically abused: Not on file    Forced sexual activity: Not on file  Other Topics Concern  . Not on file  Social History Narrative  . Not on file    Outpatient Medications Prior to Visit  Medication Sig Dispense Refill  . ipratropium (ATROVENT) 0.06 % nasal spray Place 2 sprays into both nostrils 4 (four) times daily. 15 mL 1  . mycophenolate (CELLCEPT) 250 MG capsule Take 250 mg by mouth daily.    . mycophenolate (CELLCEPT) 500 MG tablet     .  guaiFENesin-codeine 100-10 MG/5ML syrup Take 5 mLs by mouth every 6 (six) hours as needed for cough. Take mostly at bedtime 120 mL 0  . oseltamivir (TAMIFLU) 75 MG capsule Take 1 capsule (75 mg total) by mouth 2 (two) times daily. 10 capsule 0   No facility-administered medications prior to visit.     Allergies  Allergen Reactions  . Aminoglycosides     Due to Myasthenia Gravis  . Avelox [Moxifloxacin Hcl In Nacl]     Due to Myasthenia Gravis   . Beta Adrenergic Blockers     Due to Myasthenia Gravis  . Botulinum Toxins     Due to Myasthenia Gravis   . Calcium Channel Blockers     Due to Myasthenia Gravis   . Ciprofloxacin     Due to Myasthenia Gravis   . Factive [Gemifloxacin Mesylate]     Due to Myasthenia Gravis   . Floxin [Ocuflox]     Due to Myasthenia Gravis   . Interferons     Due to Myasthenia Gravis   . Levofloxacin     Due to Myasthenia Gravis   . Macrolides And Ketolides     Due to Myasthenia  Gravis   . Magnesium-Containing Compounds     Milk of mag,some antacids (like maalox or mylanta)  Due to Myasthenia Gravis   . Noroxin [Norfloxacin]     Due to Myasthenia Gravis   . Penicillamine   . Proquin Xr [Ciprofloxacin Hcl]     Due to Myasthenia Gravis   . Quinine Derivatives     Due to Myasthenia Gravis     ROS Review of Systems    Objective:    Physical Exam  Constitutional: He is oriented to person, place, and time. He appears well-developed and well-nourished.  HENT:  Head: Normocephalic and atraumatic.  Right Ear: External ear normal.  Left Ear: External ear normal.  Nose: Nose normal.  Mouth/Throat: Oropharynx is clear and moist.  Eyes: Pupils are equal, round, and reactive to light. Conjunctivae and EOM are normal.  Neck: Normal range of motion. Neck supple. No thyromegaly present.  Cardiovascular: Normal rate, regular rhythm, normal heart sounds and intact distal pulses.  Pulmonary/Chest: Effort normal and breath sounds normal.  Abdominal: Soft. Bowel sounds are normal. He exhibits no distension and no mass. There is no abdominal tenderness. There is no rebound and no guarding.  Musculoskeletal: Normal range of motion.  Lymphadenopathy:    He has no cervical adenopathy.  Neurological: He is alert and oriented to person, place, and time. He has normal reflexes.  Skin: Skin is warm and dry.  Psychiatric: He has a normal mood and affect. His behavior is normal. Judgment and thought content normal.    BP 137/73   Pulse 62   Ht 5\' 5"  (1.651 m)   Wt 158 lb (71.7 kg)   SpO2 98%   BMI 26.29 kg/m  Wt Readings from Last 3 Encounters:  05/16/18 158 lb (71.7 kg)  05/08/18 162 lb (73.5 kg)  05/05/15 157 lb (71.2 kg)     Health Maintenance Due  Topic Date Due  . HIV Screening  11/04/1998    There are no preventive care reminders to display for this patient.  Lab Results  Component Value Date   TSH 1.650 10/13/2007   Lab Results  Component Value  Date   WBC 6.3 06/25/2014   HGB 17.9 (H) 06/25/2014   HCT 50.6 06/25/2014   MCV 80.2 06/25/2014   PLT 145 (L)  06/25/2014   Lab Results  Component Value Date   NA 139 10/13/2007   K 3.6 10/13/2007   CO2 24 10/13/2007   GLUCOSE 78 10/13/2007   BUN 12 10/13/2007   CREATININE 1.06 10/13/2007   BILITOT 0.9 10/13/2007   ALKPHOS 74 10/13/2007   AST 20 10/13/2007   ALT 65 (H) 10/13/2007   PROT 7.2 10/13/2007   ALBUMIN 4.4 10/13/2007   CALCIUM 9.1 10/13/2007   No results found for: CHOL No results found for: HDL No results found for: LDLCALC No results found for: TRIG No results found for: CHOLHDL No results found for: [id number]    Assessment & Plan:   Problem List Items Addressed This Visit      Nervous and Auditory   Myasthenia gravis without exacerbation (HCC) (Chronic)    Other Visit Diagnoses    Wellness examination    -  Primary   Relevant Orders   COMPLETE METABOLIC PANEL WITH GFR   Lipid panel   PPD (Completed)   Meningococcal MCV4O(Menveo) (Completed)   Screening-pulmonary TB       Relevant Orders   PPD (Completed)   Need for meningococcus vaccine       Relevant Orders   Meningococcal MCV4O(Menveo) (Completed)   Immunity status testing       Relevant Orders   Varicella zoster antibody, IgG     Keep up a regular exercise program and make sure you are eating a healthy diet Try to eat 4 servings of dairy a day, or if you are lactose intolerant take a calcium with vitamin D daily.  Your vaccines are up to date.   For completion of his form he will need certain vaccinations updated.  He will actually live in a dormitory for about 10 weeks so they require that he have a meningitis vaccine.  That was administered today.  He will also need the hepatitis A series.  And it looks like he did receive the initial hepatitis B at work but has not had all 3.  We can either do titers or we can just complete his series.  I would recommend that we complete his series. He will  need blood test for varicella.    TB skin test placed today as well.  Is physically fit for participation.  No orders of the defined types were placed in this encounter.   Follow-up: Return in about 1 year (around 05/17/2019) for wellness exam.    Nani Gasseratherine Metheney, MD

## 2018-05-17 ENCOUNTER — Encounter: Payer: Self-pay | Admitting: *Deleted

## 2018-05-17 LAB — LIPID PANEL
CHOL/HDL RATIO: 3.5 (calc) (ref <5.0)
Cholesterol: 162 mg/dL (ref <200)
HDL: 46 mg/dL (ref >=40)
LDL Cholesterol (Calc): 99 mg/dL (calc)
Non-HDL Cholesterol (Calc): 116 mg/dL (calc) (ref <130)
Triglycerides: 81 mg/dL (ref <150)

## 2018-05-17 LAB — COMPLETE METABOLIC PANEL WITH GFR
AG Ratio: 1.8 (calc) (ref 1.0–2.5)
ALBUMIN MSPROF: 4.7 g/dL (ref 3.6–5.1)
ALT: 28 U/L (ref 9–46)
AST: 26 U/L (ref 10–40)
Alkaline phosphatase (APISO): 49 U/L (ref 36–130)
BILIRUBIN TOTAL: 0.5 mg/dL (ref 0.2–1.2)
BUN: 16 mg/dL (ref 7–25)
CO2: 30 mmol/L (ref 20–32)
CREATININE: 0.96 mg/dL (ref 0.60–1.35)
Calcium: 9 mg/dL (ref 8.6–10.3)
Chloride: 102 mmol/L (ref 98–110)
GFR, Est African American: 119 mL/min/{1.73_m2} (ref >=60)
GFR, Est Non African American: 103 mL/min/{1.73_m2} (ref >=60)
Globulin: 2.6 g/dL (calc) (ref 1.9–3.7)
Glucose, Bld: 86 mg/dL (ref 65–99)
Potassium: 4.1 mmol/L (ref 3.5–5.3)
Sodium: 141 mmol/L (ref 135–146)
Total Protein: 7.3 g/dL (ref 6.1–8.1)

## 2018-05-17 LAB — VARICELLA ZOSTER ANTIBODY, IGG: Varicella IgG: 2583 index

## 2018-05-18 ENCOUNTER — Encounter: Payer: Self-pay | Admitting: *Deleted

## 2018-05-18 ENCOUNTER — Ambulatory Visit (INDEPENDENT_AMBULATORY_CARE_PROVIDER_SITE_OTHER): Payer: BC Managed Care – PPO | Admitting: Family Medicine

## 2018-05-18 VITALS — Temp 98.3°F

## 2018-05-18 DIAGNOSIS — Z111 Encounter for screening for respiratory tuberculosis: Secondary | ICD-10-CM | POA: Diagnosis not present

## 2018-05-18 DIAGNOSIS — Z23 Encounter for immunization: Secondary | ICD-10-CM | POA: Diagnosis not present

## 2018-05-18 MED ORDER — HEPATITIS B VAC RECOMBINANT 10 MCG/0.5ML IJ SUSP
0.5000 mL | Freq: Once | INTRAMUSCULAR | Status: AC
  Administered 2018-05-18: 0.5 mL via INTRAMUSCULAR

## 2018-05-18 MED ORDER — HEPATITIS A VACCINE 1440 EL U/ML IM SUSP
1.0000 mL | Freq: Once | INTRAMUSCULAR | Status: AC
  Administered 2018-05-18: 1440 [IU] via INTRAMUSCULAR

## 2018-05-18 NOTE — Progress Notes (Signed)
Agree with above.  He will follow-up in 6 months for second hepatitis A.  And this hepatitis B completes his series.  He did bring a sheet with him that confirmed 2 additional dates are his hepatitis B in addition to the one that he dropped off the other day so we will get his chart updated.  And we will get form completed.   Agree with documentation as above.   Nani Gasser, MD

## 2018-05-18 NOTE — Patient Instructions (Signed)
Return in 6 months for last Hep A vaccine.

## 2018-05-18 NOTE — Progress Notes (Signed)
Established Patient Office Visit  Subjective:  Patient ID: Jacob Kline, male    DOB: 02/09/1986  Age: 33 y.o. MRN: [id number]  CC: No chief complaint on file.   HPI Jacob Kline presents for PPD read. Also for Hep A and B vaccine.   Past Medical History:  Diagnosis Date  . GERD (gastroesophageal reflux disease)   . Myasthenia gravis    without exacerbation 358.00    Past Surgical History:  Procedure Laterality Date  . THYMECTOMY  05-2007  . tibia and fibia fracture  2003   RT  . TONSILLECTOMY      Family History  Problem Relation Age of Onset  . Cancer Maternal Grandmother   . Diabetes Maternal Grandmother   . Hyperlipidemia Mother   . Hypertension Mother   . Hyperlipidemia Father   . Hypertension Father     Social History   Socioeconomic History  . Marital status: Married    Spouse name: Not on file  . Number of children: Not on file  . Years of education: Not on file  . Highest education level: Not on file  Occupational History  . Not on file  Social Needs  . Financial resource strain: Not on file  . Food insecurity:    Worry: Not on file    Inability: Not on file  . Transportation needs:    Medical: Not on file    Non-medical: Not on file  Tobacco Use  . Smoking status: Former Games developer  . Smokeless tobacco: Never Used  Substance and Sexual Activity  . Alcohol use: No  . Drug use: No  . 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   Lifestyle  . Physical activity:    Days per week: Not on file    Minutes per session: Not on file  . Stress: Not on file  Relationships  . Social connections:    Talks on phone: Not on file    Gets together: Not on file    Attends religious service: Not on file    Active member of club or organization: Not on file    Attends meetings of clubs or organizations: Not on file    Relationship status: Not on file  . Intimate  partner violence:    Fear of current or ex partner: Not on file    Emotionally abused: Not on file    Physically abused: Not on file    Forced sexual activity: Not on file  Other Topics Concern  . Not on file  Social History Narrative  . Not on file    Outpatient Medications Prior to Visit  Medication Sig Dispense Refill  . ipratropium (ATROVENT) 0.06 % nasal spray Place 2 sprays into both nostrils 4 (four) times daily. 15 mL 1  . mycophenolate (CELLCEPT) 250 MG capsule Take 250 mg by mouth daily.    . mycophenolate (CELLCEPT) 500 MG tablet      No facility-administered medications prior to visit.     Allergies  Allergen Reactions  . Calcium Carbonate Antacid     Other reaction(s): Other Patient cannot take due to his Myasthenia gravis  . Neomycin     Other reaction(s): Other increase weakness (Myasthenia Gravis) increase weakness (Myasthenia Gravis)  . Procainamide     Other reaction(s): Other increase weakness (Myasthenia Gravis) increase weakness (Myasthenia Gravis)  . Streptomycin     Other reaction(s): Other increase weakness (  Myasthenia Gravis) increase weakness (Myasthenia Gravis)  . Aminoglycosides     Due to Myasthenia Gravis  . Avelox [Moxifloxacin Hcl In Nacl]     Due to Myasthenia Gravis   . Beta Adrenergic Blockers     Due to Myasthenia Gravis  . Botulinum Toxins     Due to Myasthenia Gravis   . Calcium Channel Blockers     Due to Myasthenia Gravis   . Ciprofloxacin     Due to Myasthenia Gravis   . Factive [Gemifloxacin Mesylate]     Due to Myasthenia Gravis   . Floxin [Ocuflox]     Due to Myasthenia Gravis   . Interferons     Due to Myasthenia Gravis   . Levofloxacin     Due to Myasthenia Gravis   . Limonene     Other reaction(s): Other (See Comments) Due to Myasthenia Gravis Milk of mag,some antacids (like maalox or mylanta) Due to Myasthenia Gravis  . Macrolides And Ketolides     Due to Myasthenia Gravis   . Magnesium-Containing  Compounds     Milk of mag,some antacids (like maalox or mylanta)  Due to Myasthenia Gravis   . Noroxin [Norfloxacin]     Due to Myasthenia Gravis   . Penicillamine   . Proquin Xr [Ciprofloxacin Hcl]     Due to Myasthenia Gravis   . Quinine Derivatives     Due to Myasthenia Gravis     ROS Review of Systems    Objective:    Physical Exam  There were no vitals taken for this visit. Wt Readings from Last 3 Encounters:  05/16/18 158 lb (71.7 kg)  05/08/18 162 lb (73.5 kg)  05/05/15 157 lb (71.2 kg)     Health Maintenance Due  Topic Date Due  . HIV Screening  11/04/1998    There are no preventive care reminders to display for this patient.  Lab Results  Component Value Date   TSH 1.650 10/13/2007   Lab Results  Component Value Date   WBC 6.3 06/25/2014   HGB 17.9 (H) 06/25/2014   HCT 50.6 06/25/2014   MCV 80.2 06/25/2014   PLT 145 (L) 06/25/2014   Lab Results  Component Value Date   NA 141 05/16/2018   K 4.1 05/16/2018   CO2 30 05/16/2018   GLUCOSE 86 05/16/2018   BUN 16 05/16/2018   CREATININE 0.96 05/16/2018   BILITOT 0.5 05/16/2018   ALKPHOS 74 10/13/2007   AST 26 05/16/2018   ALT 28 05/16/2018   PROT 7.3 05/16/2018   ALBUMIN 4.4 10/13/2007   CALCIUM 9.0 05/16/2018   Lab Results  Component Value Date   CHOL 162 05/16/2018   Lab Results  Component Value Date   HDL 46 05/16/2018   Lab Results  Component Value Date   LDLCALC 99 05/16/2018   Lab Results  Component Value Date   TRIG 81 05/16/2018   Lab Results  Component Value Date   CHOLHDL 3.5 05/16/2018   No results found for: HGBA1C    Assessment & Plan:  PPD - negative 0 mm. Patient tolerated injections well without complications. Patient advised to schedule next injection 6 months from today.    Problem List Items Addressed This Visit    None    Visit Diagnoses    Need for hepatitis B vaccination    -  Primary   Relevant Medications   hepatitis b vaccine (ENGERIX-B)  injection 0.5 mL (Completed) (Start on 05/18/2018  9:00 AM)  Need for hepatitis A vaccination       Relevant Medications   hepatitis A virus (PF) vaccine (HAVRIX (PF)) injection 1,440 Units (Completed) (Start on 05/18/2018  9:00 AM)      Meds ordered this encounter  Medications  . hepatitis A virus (PF) vaccine (HAVRIX (PF)) injection 1,440 Units  . hepatitis b vaccine (ENGERIX-B) injection 0.5 mL    Follow-up: Return in about 6 months (around 11/16/2018) for last Hep A vaccine.Earna Coder, Janalyn Harder, CMA

## 2018-10-10 ENCOUNTER — Ambulatory Visit
Admission: RE | Admit: 2018-10-10 | Discharge: 2018-10-10 | Disposition: A | Discharge status: Home / Self Care | Payer: BC Managed Care – PPO | Source: Ambulatory Visit | Attending: Chiropractic Medicine | Admitting: Chiropractic Medicine

## 2018-10-10 ENCOUNTER — Other Ambulatory Visit: Payer: Self-pay | Admitting: Chiropractic Medicine

## 2018-10-10 ENCOUNTER — Other Ambulatory Visit: Payer: Self-pay

## 2018-10-10 DIAGNOSIS — M542 Cervicalgia: Secondary | ICD-10-CM

## 2018-10-10 DIAGNOSIS — M546 Pain in thoracic spine: Secondary | ICD-10-CM

## 2018-12-18 ENCOUNTER — Ambulatory Visit: Payer: BC Managed Care – PPO

## 2018-12-20 ENCOUNTER — Other Ambulatory Visit: Payer: Self-pay

## 2018-12-20 ENCOUNTER — Ambulatory Visit (INDEPENDENT_AMBULATORY_CARE_PROVIDER_SITE_OTHER): Payer: BC Managed Care – PPO | Admitting: Family Medicine

## 2018-12-20 VITALS — BP 147/83 | HR 74 | Temp 98.2°F | Wt 167.0 lb

## 2018-12-20 DIAGNOSIS — Z23 Encounter for immunization: Secondary | ICD-10-CM | POA: Diagnosis not present

## 2018-12-20 NOTE — Progress Notes (Signed)
Agree with documentation as above.   Catherine Metheney, MD  

## 2018-12-20 NOTE — Progress Notes (Signed)
Pt in office today for 2nd Hepatitis A. Hepatitis A given in r deltoid. Pt also received flu vaccine while in office.Pt tolerated injections well with no immediate reaction.

## 2019-04-02 ENCOUNTER — Ambulatory Visit: Payer: BC Managed Care – PPO | Admitting: Family Medicine

## 2019-04-02 DIAGNOSIS — J9601 Acute respiratory failure with hypoxia: Secondary | ICD-10-CM | POA: Insufficient documentation

## 2019-04-02 DIAGNOSIS — R000 Tachycardia, unspecified: Secondary | ICD-10-CM | POA: Insufficient documentation

## 2019-04-02 DIAGNOSIS — U071 COVID-19: Secondary | ICD-10-CM | POA: Insufficient documentation

## 2019-04-03 ENCOUNTER — Ambulatory Visit: Payer: BC Managed Care – PPO | Admitting: Family Medicine

## 2019-04-08 ENCOUNTER — Encounter: Payer: Self-pay | Admitting: Family Medicine

## 2019-04-09 ENCOUNTER — Encounter: Payer: Self-pay | Admitting: Family Medicine

## 2019-04-09 ENCOUNTER — Ambulatory Visit (INDEPENDENT_AMBULATORY_CARE_PROVIDER_SITE_OTHER): Payer: BC Managed Care – PPO | Admitting: Family Medicine

## 2019-04-09 ENCOUNTER — Other Ambulatory Visit: Payer: Self-pay

## 2019-04-09 ENCOUNTER — Ambulatory Visit (INDEPENDENT_AMBULATORY_CARE_PROVIDER_SITE_OTHER): Payer: BC Managed Care – PPO

## 2019-04-09 VITALS — BP 110/79 | HR 94 | Ht 65.0 in

## 2019-04-09 DIAGNOSIS — J1282 Pneumonia due to coronavirus disease 2019: Secondary | ICD-10-CM | POA: Diagnosis not present

## 2019-04-09 DIAGNOSIS — U071 COVID-19: Secondary | ICD-10-CM

## 2019-04-09 DIAGNOSIS — E876 Hypokalemia: Secondary | ICD-10-CM

## 2019-04-09 DIAGNOSIS — R748 Abnormal levels of other serum enzymes: Secondary | ICD-10-CM | POA: Diagnosis not present

## 2019-04-09 DIAGNOSIS — R7989 Other specified abnormal findings of blood chemistry: Secondary | ICD-10-CM

## 2019-04-09 MED ORDER — GENERIC EXTERNAL MEDICATION
Status: DC
Start: ? — End: 2019-04-09

## 2019-04-09 MED ORDER — HYDROCODONE-ACETAMINOPHEN 10-325 MG PO TABS
1.00 | ORAL_TABLET | ORAL | Status: DC
Start: ? — End: 2019-04-09

## 2019-04-09 MED ORDER — HYDROCODONE-ACETAMINOPHEN 5-325 MG PO TABS
1.00 | ORAL_TABLET | ORAL | Status: DC
Start: ? — End: 2019-04-09

## 2019-04-09 MED ORDER — PROMETHAZINE HCL 25 MG/ML IJ SOLN
12.50 | INTRAMUSCULAR | Status: DC
Start: ? — End: 2019-04-09

## 2019-04-09 MED ORDER — SODIUM CHLORIDE 0.9 % IV SOLN
10.00 | INTRAVENOUS | Status: DC
Start: ? — End: 2019-04-09

## 2019-04-09 MED ORDER — DEXAMETHASONE 4 MG PO TABS
6.00 | ORAL_TABLET | ORAL | Status: DC
Start: 2019-04-08 — End: 2019-04-09

## 2019-04-09 MED ORDER — IPRATROPIUM BROMIDE 0.06 % NA SOLN
1.00 | NASAL | Status: DC
Start: 2019-04-07 — End: 2019-04-09

## 2019-04-09 MED ORDER — ENOXAPARIN SODIUM 40 MG/0.4ML ~~LOC~~ SOLN
0.50 | SUBCUTANEOUS | Status: DC
Start: 2019-04-07 — End: 2019-04-09

## 2019-04-09 MED ORDER — MYCOPHENOLATE MOFETIL 250 MG PO CAPS
750.00 | ORAL_CAPSULE | ORAL | Status: DC
Start: 2019-04-07 — End: 2019-04-09

## 2019-04-09 MED ORDER — PANTOPRAZOLE SODIUM 40 MG PO TBEC
40.00 | DELAYED_RELEASE_TABLET | ORAL | Status: DC
Start: 2019-04-08 — End: 2019-04-09

## 2019-04-09 MED ORDER — POLYETHYLENE GLYCOL 3350 17 GM/SCOOP PO POWD
17.00 | ORAL | Status: DC
Start: ? — End: 2019-04-09

## 2019-04-09 MED ORDER — ALBUTEROL SULFATE HFA 108 (90 BASE) MCG/ACT IN AERS
2.00 | INHALATION_SPRAY | RESPIRATORY_TRACT | Status: DC
Start: ? — End: 2019-04-09

## 2019-04-09 MED ORDER — SODIUM CHLORIDE 0.9 % IV SOLN
30.00 | INTRAVENOUS | Status: DC
Start: 2019-04-07 — End: 2019-04-09

## 2019-04-09 MED ORDER — GENERIC EXTERNAL MEDICATION
600.00 | Status: DC
Start: ? — End: 2019-04-09

## 2019-04-09 NOTE — Progress Notes (Signed)
Established Patient Office Visit  Subjective:  Patient ID: Jacob Kline, male    DOB: Dec 07, 1985  Age: 34 y.o. MRN: [id number]  CC:  Chief Complaint  Patient presents with  . Hospitalization Follow-up    HPI Jacob Kline presents for hospital follow- up.  He was actually diagnosed with Covid on 03/20/2019.  Symptoms were resolving initially but then started to progress.  He was evaluated initially on January 9 and diagnosed with pneumonia both lower labs.  Unfortunately, he progressed to acute hypoxemic respiratory failure and fever due to Covid and was admitted on January 11.  ED work-up was significant for mild hypokalemia as well as a pro calcitonin level of 0.138.  Negative for pulmonary embolism by CTA.  He was placed on 2 L nasal cannula.  He was treated with 5 days of Decadron and 5 days of remdesivir.  Gradually weaned off of oxygen with sat around 94% on room air.  Given as needed albuterol inhaler and incentive spirometer.  He does have a history of underlying myasthenia gravis and GERD.  He is feeling 50% better than d/c on Saturday.  Still feels very fatigued but he has been able to get up and move around and walk around.  Still some cough.  He does feel short of breath going up and down steps.  But he feels like his strength is gradually getting better since he came home on Saturday.  He was told to get a repeat chest x-ray and felt little like he still had some persistent pneumonia to consider another course of antibiotics because his ferritin was actually increasing upon discharge.  His D-dimer was also increasing.  It was also noted on his labs that his liver enzymes have more than doubled before discharge and his potassium had dropped.  He said he did have some cramping particularly in his calves on Saturday but says it has gotten a little bit better.  He still is on the Decadron and has 3 more days before he is completed that.    Past Medical History:  Diagnosis  Date  . GERD (gastroesophageal reflux disease)   . Myasthenia gravis    without exacerbation 358.00    Past Surgical History:  Procedure Laterality Date  . THYMECTOMY  05-2007  . tibia and fibia fracture  2003   RT  . TONSILLECTOMY      Family History  Problem Relation Age of Onset  . Cancer Maternal Grandmother   . Diabetes Maternal Grandmother   . Hyperlipidemia Mother   . Hypertension Mother   . Hyperlipidemia Father   . Hypertension Father     Social History   Socioeconomic History  . Marital status: Married    Spouse name: Not on file  . Number of children: Not on file  . Years of education: Not on file  . Highest education level: Not on file  Occupational History  . Not on file  Tobacco Use  . Smoking status: Former Games developer  . Smokeless tobacco: Never Used  Substance and Sexual Activity  . Alcohol use: No  . Drug use: No  . 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   Other Topics Concern  . Not on file  Social History Narrative  . Not on file   Social Determinants of Health   Financial Resource Strain:   . Difficulty of Paying Living Expenses: Not on file  Food  Insecurity:   . Worried About Programme researcher, broadcasting/film/video in the Last Year: Not on file  . Ran Out of Food in the Last Year: Not on file  Transportation Needs:   . Lack of Transportation (Medical): Not on file  . Lack of Transportation (Non-Medical): Not on file  Physical Activity:   . Days of Exercise per Week: Not on file  . Minutes of Exercise per Session: Not on file  Stress:   . Feeling of Stress : Not on file  Social Connections:   . Frequency of Communication with Friends and Family: Not on file  . Frequency of Social Gatherings with Friends and Family: Not on file  . Attends Religious Services: Not on file  . Active Member of Clubs or Organizations: Not on file  . Attends Banker Meetings: Not  on file  . Marital Status: Not on file  Intimate Partner Violence:   . Fear of Current or Ex-Partner: Not on file  . Emotionally Abused: Not on file  . Physically Abused: Not on file  . Sexually Abused: Not on file    Outpatient Medications Prior to Visit  Medication Sig Dispense Refill  . dexamethasone (DECADRON) 6 MG tablet Take by mouth.    . loratadine (CLARITIN) 10 MG tablet Take 10 mg by mouth daily.    . mycophenolate (CELLCEPT) 250 MG capsule Take 250 mg by mouth daily.    Marland Kitchen ipratropium (ATROVENT) 0.06 % nasal spray Place 2 sprays into both nostrils 4 (four) times daily. 15 mL 1  . mycophenolate (CELLCEPT) 500 MG tablet      No facility-administered medications prior to visit.    Allergies  Allergen Reactions  . Calcium Carbonate Antacid     Other reaction(s): Other Patient cannot take due to his Myasthenia gravis  . Neomycin     Other reaction(s): Other increase weakness (Myasthenia Gravis) increase weakness (Myasthenia Gravis)  . Procainamide     Other reaction(s): Other increase weakness (Myasthenia Gravis) increase weakness (Myasthenia Gravis)  . Streptomycin     Other reaction(s): Other increase weakness (Myasthenia Gravis) increase weakness (Myasthenia Gravis)  . Aminoglycosides     Due to Myasthenia Gravis  . Avelox [Moxifloxacin Hcl In Nacl]     Due to Myasthenia Gravis   . Beta Adrenergic Blockers     Due to Myasthenia Gravis  . Botulinum Toxins     Due to Myasthenia Gravis   . Calcium Channel Blockers     Due to Myasthenia Gravis   . Ciprofloxacin     Due to Myasthenia Gravis   . Factive [Gemifloxacin Mesylate]     Due to Myasthenia Gravis   . Floxin [Ocuflox]     Due to Myasthenia Gravis   . Interferons     Due to Myasthenia Gravis   . Levofloxacin     Due to Myasthenia Gravis   . Limonene     Other reaction(s): Other (See Comments) Due to Myasthenia Gravis Milk of mag,some antacids (like maalox or mylanta) Due to Myasthenia  Gravis  . Macrolides And Ketolides     Due to Myasthenia Gravis   . Magnesium-Containing Compounds     Milk of mag,some antacids (like maalox or mylanta)  Due to Myasthenia Gravis   . Noroxin [Norfloxacin]     Due to Myasthenia Gravis   . Penicillamine   . Proquin Xr [Ciprofloxacin Hcl]     Due to Myasthenia Gravis   . Quinine Derivatives  Due to Myasthenia Gravis     ROS Review of Systems    Objective:    Physical Exam  Constitutional: He is oriented to person, place, and time. He appears well-developed and well-nourished.  HENT:  Head: Normocephalic and atraumatic.  Eyes: Conjunctivae and EOM are normal.  Pulmonary/Chest: Effort normal.  Neurological: He is alert and oriented to person, place, and time.  Skin: Skin is dry. No pallor.  Psychiatric: He has a normal mood and affect. His behavior is normal.  Vitals reviewed.   BP 110/79   Pulse 94   Ht 5\' 5"  (1.651 m)   SpO2 94%   BMI 27.79 kg/m  Wt Readings from Last 3 Encounters:  12/20/18 167 lb (75.8 kg)  05/16/18 158 lb (71.7 kg)  05/08/18 162 lb (73.5 kg)     Health Maintenance Due  Topic Date Due  . HIV Screening  11/04/1998    There are no preventive care reminders to display for this patient.  Lab Results  Component Value Date   TSH 1.650 10/13/2007   Lab Results  Component Value Date   WBC 6.3 06/25/2014   HGB 17.9 (H) 06/25/2014   HCT 50.6 06/25/2014   MCV 80.2 06/25/2014   PLT 145 (L) 06/25/2014   Lab Results  Component Value Date   NA 141 05/16/2018   K 4.1 05/16/2018   CO2 30 05/16/2018   GLUCOSE 86 05/16/2018   BUN 16 05/16/2018   CREATININE 0.96 05/16/2018   BILITOT 0.5 05/16/2018   ALKPHOS 74 10/13/2007   AST 26 05/16/2018   ALT 28 05/16/2018   PROT 7.3 05/16/2018   ALBUMIN 4.4 10/13/2007   CALCIUM 9.0 05/16/2018   Lab Results  Component Value Date   CHOL 162 05/16/2018   Lab Results  Component Value Date   HDL 46 05/16/2018   Lab Results  Component Value  Date   LDLCALC 99 05/16/2018   Lab Results  Component Value Date   TRIG 81 05/16/2018   Lab Results  Component Value Date   CHOLHDL 3.5 05/16/2018   No results found for: HGBA1C    Assessment & Plan:   Problem List Items Addressed This Visit    None    Visit Diagnoses    Pneumonia due to 2019-nCoV    -  Primary   Relevant Medications   loratadine (CLARITIN) 10 MG tablet   Other Relevant Orders   Ferritin   D-Dimer, Quantitative   DG Chest 2 View (Completed)   COMPLETE METABOLIC PANEL WITH GFR   Hypokalemia       Relevant Orders   COMPLETE METABOLIC PANEL WITH GFR   Elevated liver enzymes       Relevant Orders   COMPLETE METABOLIC PANEL WITH GFR   Elevated ferritin       Relevant Orders   Ferritin   Elevated d-dimer       Relevant Orders   D-Dimer, Quantitative      Covid 19 pneumonia-he actually feels about 50% better.  We did repeat chest x-ray today so take a look at that and send him a note in regards to whether or not we need to consider restarting antibiotics or not but likely viral cause of hopefully continuing to improve as he is also symptomatically improving.  Hypokalemia-due to recheck labs.  Elevated in liver enzymes-one of his liver enzymes actually doubled right before discharge so would like to recheck that and make sure that is not continuing to increase for some  reason.  Elevated D-dimer-he was on Lovenox injections and in the hospital but he is up and moving around.  Even though he still feeling quite fatigued he is not just lying in bed so as long as he is continuing to move and be active then we can hold off on the Lovenox.  Elevated ferritin-we will plan to recheck.  Very strangely his levels were continuing to increase during hospitalization  No orders of the defined types were placed in this encounter.   Follow-up: No follow-ups on file.    Beatrice Lecher, MD

## 2019-04-09 NOTE — Progress Notes (Signed)
Pt was d/c on 04/07/2019. He reports that the doctor at the hospital advised him to have his ferritin and d-dimer rechecked. Pt was told that this was NOT on the discharge summary. He stated that his ferritin kept going up while he was in the hospital and this is why he felt like it needed to be rechecked

## 2019-04-10 ENCOUNTER — Encounter: Payer: Self-pay | Admitting: Family Medicine

## 2019-04-10 DIAGNOSIS — R748 Abnormal levels of other serum enzymes: Secondary | ICD-10-CM

## 2019-04-10 NOTE — Telephone Encounter (Signed)
Dr Linford Arnold, please see patient's note.   I have ordered the repeat CMP for Friday.

## 2019-04-11 NOTE — Telephone Encounter (Signed)
Perfect.  No diet changes. Just avoid tylenol and alochol

## 2019-04-12 ENCOUNTER — Telehealth: Payer: Self-pay

## 2019-04-12 DIAGNOSIS — Z8616 Personal history of COVID-19: Secondary | ICD-10-CM

## 2019-04-12 LAB — HEPATITIS PANEL, ACUTE
Hep A IgM: NONREACTIVE
Hep B C IgM: NONREACTIVE
Hepatitis B Surface Ag: NONREACTIVE
Hepatitis C Ab: NONREACTIVE
SIGNAL TO CUT-OFF: 0.02 (ref <1.00)

## 2019-04-12 LAB — COMPLETE METABOLIC PANEL WITH GFR
AG Ratio: 1.4 (calc) (ref 1.0–2.5)
ALT: 450 U/L — ABNORMAL HIGH (ref 9–46)
AST: 261 U/L — ABNORMAL HIGH (ref 10–40)
Albumin: 4 g/dL (ref 3.6–5.1)
Alkaline phosphatase (APISO): 69 U/L (ref 36–130)
BUN: 20 mg/dL (ref 7–25)
CO2: 29 mmol/L (ref 20–32)
Calcium: 9 mg/dL (ref 8.6–10.3)
Chloride: 104 mmol/L (ref 98–110)
Creat: 0.89 mg/dL (ref 0.60–1.35)
GFR, Est African American: 128 mL/min/{1.73_m2} (ref >=60)
GFR, Est Non African American: 111 mL/min/{1.73_m2} (ref >=60)
Globulin: 2.8 g/dL (calc) (ref 1.9–3.7)
Glucose, Bld: 112 mg/dL (ref 65–139)
Potassium: 4.5 mmol/L (ref 3.5–5.3)
Sodium: 142 mmol/L (ref 135–146)
Total Bilirubin: 0.7 mg/dL (ref 0.2–1.2)
Total Protein: 6.8 g/dL (ref 6.1–8.1)

## 2019-04-12 LAB — D-DIMER, QUANTITATIVE: D-Dimer, Quant: 0.53 mcg/mL FEU — ABNORMAL HIGH (ref <0.50)

## 2019-04-12 LAB — FERRITIN: Ferritin: 1835 ng/mL — ABNORMAL HIGH (ref 38–380)

## 2019-04-12 NOTE — Telephone Encounter (Signed)
Jacob Kline called and left a message asking if the COVID-19 antibody test could be added. Please advise.

## 2019-04-12 NOTE — Telephone Encounter (Signed)
He does not need an antibody test since he already had Covid.  Is there a particular concern that he has?  We know he had Covid as he tested positive and was hospitalized so we do not need any other confirmation.

## 2019-04-13 LAB — COMPLETE METABOLIC PANEL WITH GFR
AG Ratio: 1.5 (calc) (ref 1.0–2.5)
ALT: 135 U/L — ABNORMAL HIGH (ref 9–46)
AST: 32 U/L (ref 10–40)
Albumin: 4.1 g/dL (ref 3.6–5.1)
Alkaline phosphatase (APISO): 62 U/L (ref 36–130)
BUN: 15 mg/dL (ref 7–25)
CO2: 29 mmol/L (ref 20–32)
Calcium: 9.6 mg/dL (ref 8.6–10.3)
Chloride: 103 mmol/L (ref 98–110)
Creat: 1.06 mg/dL (ref 0.60–1.35)
GFR, Est African American: 105 mL/min/{1.73_m2} (ref >=60)
GFR, Est Non African American: 90 mL/min/{1.73_m2} (ref >=60)
Globulin: 2.8 g/dL (calc) (ref 1.9–3.7)
Glucose, Bld: 81 mg/dL (ref 65–99)
Potassium: 4.1 mmol/L (ref 3.5–5.3)
Sodium: 142 mmol/L (ref 135–146)
Total Bilirubin: 0.7 mg/dL (ref 0.2–1.2)
Total Protein: 6.9 g/dL (ref 6.1–8.1)

## 2019-04-13 NOTE — Telephone Encounter (Signed)
Patient advised.

## 2019-04-13 NOTE — Telephone Encounter (Signed)
Vernon would like to have it done. He as no reason for wanting it done. I did explain that we know he had COVID-19. He wants to know if he has antibodies.

## 2019-04-13 NOTE — Telephone Encounter (Signed)
Ok to order but he insurance may not pay for it

## 2019-04-14 LAB — SAR COV2 SEROLOGY (COVID19)AB(IGG),IA: SARS CoV2 AB IGG: POSITIVE — AB

## 2019-04-17 ENCOUNTER — Encounter: Payer: Self-pay | Admitting: Family Medicine

## 2019-04-17 NOTE — Telephone Encounter (Signed)
OK for note stating patient can return to work as of 04/16/19?
